# Patient Record
Sex: Male | Born: 1945 | Race: White | Hispanic: No | Marital: Married | Smoking: Former smoker
Health system: Southern US, Community
[De-identification: ages and names within clinical notes are randomized; demographics above are authoritative.]

## PROBLEM LIST (undated history)

## (undated) DIAGNOSIS — E119 Type 2 diabetes mellitus without complications: Secondary | ICD-10-CM

## (undated) DIAGNOSIS — N179 Acute kidney failure, unspecified: Secondary | ICD-10-CM

## (undated) DIAGNOSIS — I10 Essential (primary) hypertension: Secondary | ICD-10-CM

## (undated) DIAGNOSIS — I509 Heart failure, unspecified: Secondary | ICD-10-CM

---

## 2016-08-05 ENCOUNTER — Other Ambulatory Visit (HOSPITAL_COMMUNITY): Payer: Medicare Other

## 2016-08-05 ENCOUNTER — Inpatient Hospital Stay
Admission: EM | Admit: 2016-08-05 | Discharge: 2016-09-04 | Disposition: A | Discharge status: Skilled Nursing Facility | Payer: Medicare Other | Source: Intra-hospital | Attending: Internal Medicine | Admitting: Internal Medicine

## 2016-08-05 DIAGNOSIS — K567 Ileus, unspecified: Secondary | ICD-10-CM

## 2016-08-05 DIAGNOSIS — R609 Edema, unspecified: Secondary | ICD-10-CM

## 2016-08-05 DIAGNOSIS — T85598A Other mechanical complication of other gastrointestinal prosthetic devices, implants and grafts, initial encounter: Secondary | ICD-10-CM

## 2016-08-05 DIAGNOSIS — J9690 Respiratory failure, unspecified, unspecified whether with hypoxia or hypercapnia: Secondary | ICD-10-CM

## 2016-08-05 DIAGNOSIS — Z0189 Encounter for other specified special examinations: Secondary | ICD-10-CM

## 2016-08-05 DIAGNOSIS — O2230 Deep phlebothrombosis in pregnancy, unspecified trimester: Secondary | ICD-10-CM

## 2016-08-05 DIAGNOSIS — K829 Disease of gallbladder, unspecified: Secondary | ICD-10-CM

## 2016-08-05 DIAGNOSIS — J189 Pneumonia, unspecified organism: Secondary | ICD-10-CM

## 2016-08-05 DIAGNOSIS — R0602 Shortness of breath: Secondary | ICD-10-CM

## 2016-08-05 HISTORY — DX: Heart failure, unspecified: I50.9

## 2016-08-05 HISTORY — DX: Acute kidney failure, unspecified: N17.9

## 2016-08-05 HISTORY — DX: Essential (primary) hypertension: I10

## 2016-08-05 HISTORY — DX: Type 2 diabetes mellitus without complications: E11.9

## 2016-08-06 ENCOUNTER — Other Ambulatory Visit (HOSPITAL_COMMUNITY): Payer: Medicare Other

## 2016-08-06 LAB — CBC
HEMATOCRIT: 28.8 % — AB (ref 39.0–52.0)
Hemoglobin: 9.1 g/dL — ABNORMAL LOW (ref 13.0–17.0)
MCH: 27.2 pg (ref 26.0–34.0)
MCHC: 31.6 g/dL (ref 30.0–36.0)
MCV: 86.2 fL (ref 78.0–100.0)
Platelets: 141 10*3/uL — ABNORMAL LOW (ref 150–400)
RBC: 3.34 MIL/uL — AB (ref 4.22–5.81)
RDW: 15.3 % (ref 11.5–15.5)
WBC: 8.8 10*3/uL (ref 4.0–10.5)

## 2016-08-06 LAB — URINALYSIS, ROUTINE W REFLEX MICROSCOPIC
GLUCOSE, UA: NEGATIVE mg/dL
KETONES UR: 15 mg/dL — AB
Nitrite: POSITIVE — AB
PROTEIN: 100 mg/dL — AB
Specific Gravity, Urine: 1.013 (ref 1.005–1.030)
pH: 5 (ref 5.0–8.0)

## 2016-08-06 LAB — COMPREHENSIVE METABOLIC PANEL
ALT: 9 U/L — ABNORMAL LOW (ref 17–63)
AST: 15 U/L (ref 15–41)
Albumin: 3.1 g/dL — ABNORMAL LOW (ref 3.5–5.0)
Alkaline Phosphatase: 45 U/L (ref 38–126)
Anion gap: 14 (ref 5–15)
BUN: 118 mg/dL — AB (ref 6–20)
CHLORIDE: 100 mmol/L — AB (ref 101–111)
CO2: 21 mmol/L — AB (ref 22–32)
Calcium: 8.1 mg/dL — ABNORMAL LOW (ref 8.9–10.3)
Creatinine, Ser: 4.79 mg/dL — ABNORMAL HIGH (ref 0.61–1.24)
GFR, EST AFRICAN AMERICAN: 13 mL/min — AB (ref >60)
GFR, EST NON AFRICAN AMERICAN: 11 mL/min — AB (ref >60)
Glucose, Bld: 294 mg/dL — ABNORMAL HIGH (ref 65–99)
POTASSIUM: 4.5 mmol/L (ref 3.5–5.1)
SODIUM: 135 mmol/L (ref 135–145)
Total Bilirubin: 0.8 mg/dL (ref 0.3–1.2)
Total Protein: 5 g/dL — ABNORMAL LOW (ref 6.5–8.1)

## 2016-08-06 LAB — URINE MICROSCOPIC-ADD ON

## 2016-08-07 ENCOUNTER — Encounter: Payer: Self-pay | Admitting: Pulmonary Disease

## 2016-08-07 DIAGNOSIS — J9620 Acute and chronic respiratory failure, unspecified whether with hypoxia or hypercapnia: Secondary | ICD-10-CM

## 2016-08-07 LAB — HEPATITIS B SURFACE ANTIBODY,QUALITATIVE: HEP B S AB: NONREACTIVE

## 2016-08-07 LAB — HEPATITIS B SURFACE ANTIGEN: HEP B S AG: NEGATIVE

## 2016-08-07 LAB — HEPATITIS B CORE ANTIBODY, TOTAL: Hep B Core Total Ab: NEGATIVE

## 2016-08-07 NOTE — Consult Note (Signed)
Name: Zachary Galvan R Hunsinger MRN: [id number] DOB: 05-07-1946    ADMISSION DATE:  08/05/2016 CONSULTATION DATE: 08/07/16   REFERRING MD :  Dr. 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 He had developed acute kidney injury in the setting of sepsis and acute respiratory failure. He was placed on BiPAP but failed requiring intubation. Subsequently was extubated and failed a second time requiring reintubation. Patient required hemodialysis for acute kidney injury.  He also was reportedly found to have a fungal UTI and was treated accordingly.  Course further complicated by hematuria and anticoagulation was held.  [facility].   At baseline, family reports he is on spiriva, symbicort, prednisone 10 mg and PRN nebulizer's.  PAST MEDICAL HISTORY :   has a past medical history of AKI (acute kidney injury) (HCC); CHF (congestive heart failure) (HCC); DM2 (diabetes mellitus, type 2) (HCC); and HTN (hypertension).   has no past surgical history on file.  Prior to Admission medications   Not on File    Allergies not on file  FAMILY HISTORY:  family history is not on file.  SOCIAL HISTORY:  reports that he quit smoking about 7 years ago. His smoking use included Cigarettes. He has a 40.00 pack-year smoking history. He has never used smokeless tobacco.  REVIEW OF SYSTEMS:  Unable to complete as patient is on mechanical ventilation.   SUBJECTIVE:   VITAL SIGNS: BP: ()/()  Arterial Line BP: ()/()   PHYSICAL EXAMINATION: General:  Obese male  in NAD on vent  Neuro:  Awake/alert, MAE  HEENT:  ETT in place Cardiovascular:  s1s2 rrr, no m/r/g  Lungs:  Even/non-labored, lungs bilaterally clear, diminished lower  Abdomen:  Obese/soft, bsx4 active  Musculoskeletal:  No acute deformities  Skin:  Warm/dry, no edema    Recent Labs Lab 08/06/16 0809  NA 135  K 4.5  CL 100*  CO2 21*  BUN 118*  CREATININE 4.79*  GLUCOSE 294*     Recent Labs Lab 08/06/16 0809  HGB 9.1*  HCT 28.8*  WBC 8.8  PLT 141*    Dg Chest Port 1 View  Result Date: 08/06/2016 CLINICAL DATA:  Respiratory for EXAM: PORTABLE CHEST 1 VIEW COMPARISON:  None FINDINGS: Endotracheal tube a is 6 cm above the carina. Nd left AICD remains in place, unchanged. Heart is normal size. No confluent airspace opacities or effusions. Mild hyperinflation. IMPRESSION: Mild hyperinflation.  No acute findings. Endotracheal tube 6 cm above the carina. Electronically Signed   By: Charlett NoseKevin  Dover M.D.   On: 08/06/2016 08:08   Dg Abd Portable 1v  Result Date: 08/05/2016 CLINICAL DATA:  Orogastric tube placement EXAM: PORTABLE ABDOMEN - 1 VIEW COMPARISON:  Study obtained earlier in the day FINDINGS: Orogastric tube tip and side port are in the distal stomach. Note that the stomach is located to the right of midline. There are loops of dilated colon, likely indicative of a degree of ileus. No free air evident. IMPRESSION: Suspect a degree of ileus. Nasogastric tube tip and side port below the diaphragm stomach. Note that the stomach is located to the right of  midline. Electronically Signed   By: Bretta BangWilliam  Woodruff III M.D.   On: 08/05/2016 20:50   Dg Abd Portable 1v  Result Date: 08/05/2016 CLINICAL DATA:  OG tube placement. EXAM: PORTABLE ABDOMEN - 1 VIEW COMPARISON:  None. FINDINGS: What appears to be the tip of an OG tube overlies the distal esophagus. IMPRESSION: What appears to be the OG tube tip overlies the distal esophagus. Electronically Signed   By: Harmon PierJeffrey  Hu M.D.   On:  08/05/2016 16:30      SIGNIFICANT EVENTS  11/18  [facility]   STUDIES:     ASSESSMENT / PLAN:  Discussion:  70 y/o M with O2 + pred dependent COPD with recent admit for klebsiella bacteremia secondary to pyelonephritis with associated sepsis, AKI, prolonged vent needs.     Acute on Chronic Respiratory Failure O2 + Prednisone Dependent COPD - baseline 10 mg pred dependent  OSA / OHS Recent Klebsiella UTI / Bacteremia   Plan: Continue AC/VC as rest mode Daily SBT / WUA  Plan for SBT in am @ 10:00 with review for extubation  CXR in am  Add pulmicort, brovana + Q6 atrovent Reduce solu-medrol to 40 mg IV Q12  PCCM will see daily while orally intubated.   [name], [name] [place] [facility] Pgr: [phone] or if no answer [phone] 08/07/2016, 3:23 PM   Attending note: I have seen and examined the patient with nurse practitioner/resident and agree with the note. History, labs and imaging reviewed.  70 Y/O with O2 dependent COPD on home O2, chronic prednisone, hypertension, OSA on home vent. With recent hospitalization for urosepsis from Klebsiella. [facility] where he was extubated but failed BiPAP postextubation requiring reintubation. He is on hemodialysis for acute kidney injury and is being treated with meropenem and Diflucan for UTI.  On examination he has stable vital signs.  Weaning on pressure support mode. Labs and imaging reviewed.  Plan: We will add Pulmicort and Brovana with Atrovent for COPD. Continue tapering Solu-Medrol to 40 mg IV q12. Continue weaning. Plan to go on 5/5 PSV. We will recheck on him tomorrow morning. Discussed with patient, wife and daughter at bedside.  The patient is critically ill with multiple organ systems failure and requires high complexity decision making for assessment and support, frequent evaluation and titration of therapies, application of advanced monitoring technologies and extensive  interpretation of multiple databases. Critical Care Time devoted to patient care services described in this note independent of APP time is 35 minutes. Chilton Greathouse.  Gregg Holster MD [place] [facility] Pager [phone] If no answer or after 3pm call: [phone] 08/07/2016, 9:30 PM

## 2016-08-07 NOTE — Consult Note (Signed)
[facility] CONSULT NOTE    Date: 08/07/2016                  Patient Name:  Zachary Galvan  MRN: [id number]  DOB: [birth year]  Age / Sex: 70 y.o., male         PCP: No primary care provider on file.                 Service Requesting Consult: Hospitalisit                 Reason for Consult: Acute renal failure            History of Present Illness: Patient is a 70 y.o. male past medical history of hypertension, combined systolic and diastolic heart failure status post AICD placement, diabetes mellitus type 2, COPD, obstructive sleep apnea, hypertension, recent Klebsiella pneumonia UTI with pyelonephritis, and recent acute respiratory failure requiring mechanical ventilation. He is unable to offer any history at this point in time. The patient's wife and daughter at bedside. He is being actively managed by pulmonary critical care for his underlying respiratory failure. In regards to his acute renal failure patient's family states that he did not appear to have any underlying chronic kidney disease.  The patient's baseline creatinine appears to be 1.0. While at [facility] it appears that he received multiple dialysis treatments. We scheduled for the patient to have dialysis yesterday as well. He appears to be breathing comfortably while on the ventilator at the moment.  Medications:   Current medications: Fluconazole 200 mg IV daily, meropenem 500 mg IV twice a day, Humalog insulin, digoxin 0.125 mg daily, ipratropium 2.5 mLinhaled 3 times a day, Lantus insulin 8 units each bedtime, Xopenex 1.25 mg inhaled 3 times a day, melatonin 6 mg each bedtime, Solu-Medrol 60 mg IV every 8 hours, renal vitamin 1 tablet daily, Protonix 40 mg twice a day, requip 0.5 mg 3 times a day, Singulair 10 mg each bedtime, sotalol 120 mg twice a day, vitamin C 500 mg twice a day, vitamin D 1000units daily    Allergies: Albuterol, codeine, Lipitor, Zithromax, Bactrim,  Ceclor   Past Medical History: Past Medical History:  Diagnosis Date  . AKI (acute kidney injury) (HCC)   . CHF (congestive heart failure) (HCC)   . DM2 (diabetes mellitus, type 2) (HCC)   . HTN (hypertension)      Past Surgical History: No past surgical history on file.   Family History: No family history of end-stage renal disease.  Social History: Social History   Social History  . Marital status: Married    Spouse name: N/A  . Number of children: N/A  . Years of education: N/A   Occupational History  . Not on file.   Social History Main Topics  . Smoking status: Former Smoker    Packs/day: 1.00    Years: 40.00    Types: Cigarettes    Quit date: 08/07/2009  . Smokeless tobacco: Never Used  . Alcohol use Not on file  . Drug use: Unknown  . Sexual activity: Not on file   Other Topics Concern  . Not on file   Social History Narrative  . No narrative on file     Review of Systems: Patient cannot provide review of systems as he is maintained on the ventilator.  Vital Signs: Temperature 90.7 pulse 76 respirations 22 blood pressure 112/52 Weight trends: There were no vitals filed for this visit.  Physical Exam: General: NAD, breathing comfortably on the ventilator.  Head: Normocephalic, atraumatic.  Eyes: Anicteric, EOMI  Nose: Mucous membranes moist, not inflammed, nonerythematous.  Throat: Endotracheal tube in place   Neck: Supple, trachea midline.  Lungs:  Scattered rhonchi, vent assisted  Heart: Irregular, no rubs  Abdomen:  BS normoactive. Soft, Nondistended, non-tender.  No masses or organomegaly.  Extremities: No pretibial edema.  Neurologic: Awake and alert, able to move all 4 extremeties  Skin: No visible rashes    Lab results: Basic Metabolic Panel:  Recent Labs Lab 08/06/16 0809  NA 135  K 4.5  CL 100*  CO2 21*  GLUCOSE 294*  BUN 118*  CREATININE 4.79*  CALCIUM 8.1*    Liver Function Tests:  Recent Labs Lab  08/06/16 0809  AST 15  ALT 9*  ALKPHOS 45  BILITOT 0.8  PROT 5.0*  ALBUMIN 3.1*   No results for input(s): LIPASE, AMYLASE in the last 168 hours. No results for input(s): AMMONIA in the last 168 hours.  CBC:  Recent Labs Lab 08/06/16 0809  WBC 8.8  HGB 9.1*  HCT 28.8*  MCV 86.2  PLT 141*    Cardiac Enzymes: No results for input(s): CKTOTAL, CKMB, CKMBINDEX, TROPONINI in the last 168 hours.  BNP: Invalid input(s): POCBNP  CBG: No results for input(s): GLUCAP in the last 168 hours.  Microbiology: No results found for this or any previous visit.  Coagulation Studies: No results for input(s): LABPROT, INR in the last 72 hours.  Urinalysis:  Recent Labs  08/06/16 0113  COLORURINE BROWN*  LABSPEC 1.013  PHURINE 5.0  GLUCOSEU NEGATIVE  HGBUR LARGE*  BILIRUBINUR MODERATE*  KETONESUR 15*  PROTEINUR 100*  NITRITE POSITIVE*  LEUKOCYTESUR LARGE*      Imaging: Dg Chest Port 1 View  Result Date: 08/06/2016 CLINICAL DATA:  Respiratory for EXAM: PORTABLE CHEST 1 VIEW COMPARISON:  None FINDINGS: Endotracheal tube a is 6 cm above the carina. Nd left AICD remains in place, unchanged. Heart is normal size. No confluent airspace opacities or effusions. Mild hyperinflation. IMPRESSION: Mild hyperinflation.  No acute findings. Endotracheal tube 6 cm above the carina. Electronically Signed   By: Charlett NoseKevin  Dover M.D.   On: 08/06/2016 08:08   Dg Abd Portable 1v  Result Date: 08/05/2016 CLINICAL DATA:  Orogastric tube placement EXAM: PORTABLE ABDOMEN - 1 VIEW COMPARISON:  Study obtained earlier in the day FINDINGS: Orogastric tube tip and side port are in the distal stomach. Note that the stomach is located to the right of midline. There are loops of dilated colon, likely indicative of a degree of ileus. No free air evident. IMPRESSION: Suspect a degree of ileus. Nasogastric tube tip and side port below the diaphragm stomach. Note that the stomach is located to the right of  midline. Electronically Signed   By: Bretta BangWilliam  Woodruff III M.D.   On: 08/05/2016 20:50      Assessment & Plan: Pt is a 70 y.o. male past medical history of hypertension, combined systolic and diastolic heart failure status post AICD placement, diabetes mellitus type 2, COPD, obstructive sleep apnea, hypertension, recent Klebsiella pneumonia UTI with pyelonephritis, and recent acute respiratory failure requiring mechanical ventilation.  1. Acute renal failure/proteinuria. Baseline creatinine 1.0. Acute renal failure secndary to sepsis most likely. Patient has been requiring hemodialysis recently.  bUN yesterday was 118 with a creatinine of 4.79. Patient underwent hemodialysis yesterday. We will plan for hemodialysis again tomorrow.  2. Acute respiratory failure. Continue mechanical ventilation for now. Appreciate  input from pulmonary critical care.  3. Anemia not otherwise specified. Hemoglobin currently 9.1. Hold off on Aranesp for now.

## 2016-08-08 ENCOUNTER — Encounter (HOSPITAL_BASED_OUTPATIENT_CLINIC_OR_DEPARTMENT_OTHER): Payer: Medicare Other

## 2016-08-08 ENCOUNTER — Other Ambulatory Visit (HOSPITAL_COMMUNITY): Payer: Medicare Other

## 2016-08-08 DIAGNOSIS — M7989 Other specified soft tissue disorders: Secondary | ICD-10-CM

## 2016-08-08 DIAGNOSIS — J9620 Acute and chronic respiratory failure, unspecified whether with hypoxia or hypercapnia: Secondary | ICD-10-CM | POA: Diagnosis not present

## 2016-08-08 LAB — CBC
HCT: 27.4 % — ABNORMAL LOW (ref 39.0–52.0)
Hemoglobin: 9 g/dL — ABNORMAL LOW (ref 13.0–17.0)
MCH: 28 pg (ref 26.0–34.0)
MCHC: 32.8 g/dL (ref 30.0–36.0)
MCV: 85.1 fL (ref 78.0–100.0)
PLATELETS: 124 10*3/uL — AB (ref 150–400)
RBC: 3.22 MIL/uL — AB (ref 4.22–5.81)
RDW: 14.8 % (ref 11.5–15.5)
WBC: 11.3 10*3/uL — AB (ref 4.0–10.5)

## 2016-08-08 LAB — RENAL FUNCTION PANEL
Albumin: 2.9 g/dL — ABNORMAL LOW (ref 3.5–5.0)
Anion gap: 13 (ref 5–15)
BUN: 113 mg/dL — ABNORMAL HIGH (ref 6–20)
CALCIUM: 7.5 mg/dL — AB (ref 8.9–10.3)
CO2: 25 mmol/L (ref 22–32)
CREATININE: 3.95 mg/dL — AB (ref 0.61–1.24)
Chloride: 96 mmol/L — ABNORMAL LOW (ref 101–111)
GFR, EST AFRICAN AMERICAN: 16 mL/min — AB (ref >60)
GFR, EST NON AFRICAN AMERICAN: 14 mL/min — AB (ref >60)
Glucose, Bld: 277 mg/dL — ABNORMAL HIGH (ref 65–99)
Phosphorus: 5.6 mg/dL — ABNORMAL HIGH (ref 2.5–4.6)
Potassium: 4.7 mmol/L (ref 3.5–5.1)
SODIUM: 134 mmol/L — AB (ref 135–145)

## 2016-08-08 LAB — DIGOXIN LEVEL: DIGOXIN LVL: 1.1 ng/mL (ref 0.8–2.0)

## 2016-08-08 NOTE — Progress Notes (Signed)
Name: Zachary Galvan MRN: [id number] DOB: [birth year]    ADMISSION DATE:  08/05/2016 CONSULTATION DATE: 08/07/16   REFERRING MD :  Dr. 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 He had developed acute kidney injury in the setting of sepsis and acute respiratory failure. He was placed on BiPAP but failed requiring intubation. Subsequently was extubated and failed a second time requiring reintubation. Patient required hemodialysis for acute kidney injury.  He also was reportedly found to have a fungal UTI and was treated accordingly.  Course further complicated by hematuria and anticoagulation was held.  [facility].   At baseline, family reports he is on spiriva, symbicort, prednisone 10 mg and PRN nebulizer's.   SUBJECTIVE:  Family reports concern over LUE ultrasound (still pending).  RN reports pt vomited this am and had HD.  No wean per RT.   VITAL SIGNS: BP: ()/()  Arterial Line BP: ()/()   PHYSICAL EXAMINATION: General:  Obese male in NAD on vent  Neuro:  Awake/alert, MAE, communicates appropriately   HEENT:  ETT in place Cardiovascular:  s1s2 rrr, no m/r/g  Lungs:  Even/non-labored, lungs bilaterally clear, diminished lower  Abdomen:  Obese/soft, bsx4 active  Musculoskeletal:  No acute deformities  Skin:  Warm/dry, no edema    Recent Labs Lab 08/06/16 0809 08/08/16 0617  NA 135 134*  K 4.5 4.7  CL 100* 96*  CO2 21* 25  BUN 118* 113*  CREATININE 4.79* 3.95*  GLUCOSE 294* 277*     Recent Labs Lab 08/06/16 0809 08/08/16 0618    HGB 9.1* 9.0*  HCT 28.8* 27.4*  WBC 8.8 11.3*  PLT 141* 124*    No results found.    SIGNIFICANT EVENTS  [facility]  [facility]   STUDIES:  LUE Duplex 11/21 >>   ASSESSMENT / PLAN:  Discussion:  70 y/o M with O2 + pred dependent COPD with recent admit for klebsiella bacteremia secondary to pyelonephritis with associated sepsis, AKI, prolonged vent needs.     Acute on Chronic Respiratory Failure O2 + Prednisone Dependent COPD - baseline 10 mg pred dependent  OSA / OHS Recent Klebsiella UTI / Bacteremia   Plan: Continue AC/VC as rest mode Daily SBT / WUA  No extubation today with N/V Plan for SBT in am ~ 10:00 with review for extubation  Await 11/21 CXR evaluation Continue pulmicort, brovana + Q6 atrovent Continue solu-medrol to 40 mg IV Q12 Minimize sedating medications, recommend weaning klonopin to off    LUE Swelling   Plan: Venous Duplex pending, per primary SVC  Nausea / Vomiting   Plan: Per primary SVC  PCCM will see daily while orally intubated.   [name], [name] [place] [facility] Pgr: [phone] or if no answer [phone] 08/08/2016, 12:34 PM  Attending note: I have seen and examined the patient with nurse practitioner/resident and agree with the note. History, labs and imaging reviewed.  70 Y/O with COPD, DM, heart failure s/p AICD, DM, OSA on trilogy vent admitted with sepsis secondary to kelb urosepsis. Course complicated by kidney failure requiring HD. Re intubated after he failed  Bipap Continue weaning trials NO weaning today because of N/V Continue nebs.  The patient is critically ill with multiple organ systems failure and requires high complexity decision making for assessment and support, frequent evaluation and titration of therapies, application of advanced monitoring technologies and extensive interpretation of multiple databases. Critical Care Time devoted to patient care services described in this note independent of  APP time is 45 minutes.   Chilton GreathousePraveen Doyce Saling MD [place] [facility] Pager [phone] If no answer or after 3pm call: [phone] 08/08/2016, 3:26 PM

## 2016-08-08 NOTE — Progress Notes (Signed)
**  Preliminary report by tech**  Left upper extremity venous duplex complete. There is no obvious evidence of deep or superficial vein thrombosis involving the left upper extremity. All visualized vessels appear patent and compressible.  Results were given to the patient's nurse, Sue LushAndrea.  08/08/16 3:03 PM Olen CordialGreg Lorey Pallett RVT

## 2016-08-08 NOTE — Consult Note (Signed)
   Name: Zachary Galvan MRN: [id number] DOB: 02/22/1946    ADMISSION DATE:  08/05/2016 CONSULTATION DATE: 08/07/16   REFERRING MD :  Dr. 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 He had developed acute kidney injury in the setting of sepsis and acute respiratory failure. He was placed on BiPAP but failed requiring intubation. Subsequently was extubated and failed a second time requiring reintubation. Patient required hemodialysis for acute kidney injury.  He also was reportedly found to have a fungal UTI and was treated accordingly.  Course further complicated by hematuria and anticoagulation was held.  [facility].   At baseline, family reports he is on spiriva, symbicort, prednisone 10 mg and PRN nebulizer's.   SUBJECTIVE:  Family reports concern over LUE ultrasound (still pending).  RN reports pt vomited this am and had HD.  No wean per RT.   VITAL SIGNS: BP: ()/()  Arterial Line BP: ()/()   PHYSICAL EXAMINATION: General:  Obese male in NAD on vent  Neuro:  Awake/alert, MAE, communicates appropriately   HEENT:  ETT in place Cardiovascular:  s1s2 rrr, no m/r/g  Lungs:  Even/non-labored, lungs bilaterally clear, diminished lower  Abdomen:  Obese/soft, bsx4 active  Musculoskeletal:  No acute deformities  Skin:  Warm/dry, no edema    Recent Labs Lab 08/06/16 0809 08/08/16 0617  NA 135 134*  K 4.5 4.7  CL 100* 96*  CO2 21* 25  BUN 118* 113*  CREATININE 4.79* 3.95*  GLUCOSE 294* 277*     Recent Labs Lab 08/06/16 0809 08/08/16 0618    HGB 9.1* 9.0*  HCT 28.8* 27.4*  WBC 8.8 11.3*  PLT 141* 124*    No results found.    SIGNIFICANT EVENTS  11[facility]18  [facility]   STUDIES:  LUE Duplex 11/21 >>   ASSESSMENT / PLAN:  Discussion:  70 y/o M with O2 + pred dependent COPD with recent admit for klebsiella bacteremia secondary to pyelonephritis with associated sepsis, AKI, prolonged vent needs.     Acute on Chronic Respiratory Failure O2 + Prednisone Dependent COPD - baseline 10 mg pred dependent  OSA / OHS Recent Klebsiella UTI / Bacteremia   Plan: Continue AC/VC as rest mode Daily SBT / WUA  No extubation today with N/V Plan for SBT in am ~ 10:00 with review for extubation  CXR in am  Continue pulmicort, brovana + Q6 atrovent Continue solu-medrol to 40 mg IV Q12 Minimize sedating medications, recommend weaning klonopin to off    LUE Swelling   Plan: Venous Duplex pending, per primary SVC  Nausea / Vomiting   Plan: Per primary SVC  PCCM will see daily while orally intubated.   Canary BrimBrandi Laya Letendre, [name] [place] [facility] Pgr: [phone] or if no answer [phone] 08/08/2016, 12:30 PM

## 2016-08-09 ENCOUNTER — Other Ambulatory Visit (HOSPITAL_COMMUNITY): Payer: Medicare Other

## 2016-08-09 DIAGNOSIS — J9600 Acute respiratory failure, unspecified whether with hypoxia or hypercapnia: Secondary | ICD-10-CM | POA: Diagnosis not present

## 2016-08-09 NOTE — Progress Notes (Signed)
[facility]  ROUNDING NOTE   Subjective:  The patient had hemodialysis yesterday. He has now been extubated. Awake alert and appears to be in good spirits.   Objective:  Vital signs in last 24 hours:  Temperature 96.7 pulse 60 respirations 18 blood pressure 130/59 Physical Exam: General: No acute distress  Head: Normocephalic, atraumatic. Moist oral mucosal membranes  Eyes: Anicteric  Neck: Supple, trachea midline  Lungs:  Scattered rhonchi, normal effort  Heart: S1S2 no rubs  Abdomen:  Soft, nontender, bowel sounds present  Extremities: trace peripheral edema.  Neurologic: Nonfocal, moving all four extremities  Skin: No lesions  Access: L IJ dialysis catheter    Basic Metabolic Panel:  Recent Labs Lab 08/06/16 0809 08/08/16 0617  NA 135 134*  K 4.5 4.7  CL 100* 96*  CO2 21* 25  GLUCOSE 294* 277*  BUN 118* 113*  CREATININE 4.79* 3.95*  CALCIUM 8.1* 7.5*  PHOS  --  5.6*    Liver Function Tests:  Recent Labs Lab 08/06/16 0809 08/08/16 0617  AST 15  --   ALT 9*  --   ALKPHOS 45  --   BILITOT 0.8  --   PROT 5.0*  --   ALBUMIN 3.1* 2.9*   No results for input(s): LIPASE, AMYLASE in the last 168 hours. No results for input(s): AMMONIA in the last 168 hours.  CBC:  Recent Labs Lab 08/06/16 0809 08/08/16 0618  WBC 8.8 11.3*  HGB 9.1* 9.0*  HCT 28.8* 27.4*  MCV 86.2 85.1  PLT 141* 124*    Cardiac Enzymes: No results for input(s): CKTOTAL, CKMB, CKMBINDEX, TROPONINI in the last 168 hours.  BNP: Invalid input(s): POCBNP  CBG: No results for input(s): GLUCAP in the last 168 hours.  Microbiology: No results found for this or any previous visit.  Coagulation Studies: No results for input(s): LABPROT, INR in the last 72 hours.  Urinalysis: No results for input(s): COLORURINE, LABSPEC, PHURINE, GLUCOSEU, HGBUR, BILIRUBINUR, KETONESUR, PROTEINUR, UROBILINOGEN, NITRITE, LEUKOCYTESUR in the last 72 hours.  Invalid input(s):  APPERANCEUR    Imaging: Dg Chest Port 1 View  Result Date: 08/08/2016 CLINICAL DATA:  Ventilator dependent respiratory failure. EXAM: PORTABLE CHEST 1 VIEW COMPARISON:  08/06/2016. FINDINGS: Endotracheal tube tip in satisfactory position projecting approximately 6 cm above the carina. Left subclavian biventricular pacing defibrillator unchanged. Cardiac silhouette upper normal in size, unchanged. Thoracic aorta mildly atherosclerotic, unchanged. Hilar and mediastinal contours otherwise unremarkable. Lungs clear. Bronchovascular markings normal. Pulmonary vascularity normal. No visible pleural effusions. No pneumothorax. IMPRESSION: 1.  Support apparatus satisfactory. 2.  No acute cardiopulmonary disease. 3. Mild thoracic aortic atherosclerosis. Electronically Signed   By: Hulan Saashomas  Lawrence M.D.   On: 08/08/2016 14:10   Dg Abd Portable 1v  Result Date: 08/08/2016 CLINICAL DATA:  Ileus EXAM: PORTABLE ABDOMEN - 1 VIEW COMPARISON:  08/05/2016 FINDINGS: The descending and sigmoid colon is distended and contains stool, similar to prior. Nasogastric or orogastric tube within the moderately gas distended stomach. No evidence of small bowel obstruction. Limited study due to patient size and pannus displacement to the right. IMPRESSION: 1. Continued colonic ileus, with greater stool volume. 2. Moderate gaseous distention of the stomach. Orogastric tube in good position. Electronically Signed   By: Marnee SpringJonathon  Watts M.D.   On: 08/08/2016 13:46     Medications:       Assessment/ Plan:  70 y.o. 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  1. Acute renal failure/proteinuria. Baseline creatinine 1.0. Acute renal failure secndary to sepsis most  likely.  -  Patient still has azotemia. He reports that he is producing urine. We will plan for hemodialysis again on Friday and then reassess the patient for analysis early next week.  2. Acute respiratory failure. The patient has been successfully weaned off the ventilator and was extubated today. He appears to be doing well. Continue to monitor respiratory status closely.  3. Anemia not otherwise specified. Hemoglobin currently 9.0. Continue to monitor CBC for now. Hold off on Aranesp for now.   LOS: 0 Yamina Lenis 11/22/20173:52 PM

## 2016-08-09 NOTE — Progress Notes (Signed)
Name: Zachary Galvan MRN: [id number] DOB: 05-Jun-1946    ADMISSION DATE:  08/05/2016 CONSULTATION DATE: 08/07/16   REFERRING MD :  Dr. 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 He had developed acute kidney injury in the setting of sepsis and acute respiratory failure. He was placed on BiPAP but failed requiring intubation. Subsequently was extubated and failed a second time requiring reintubation. Patient required hemodialysis for acute kidney injury.  He also was reportedly found to have a fungal UTI and was treated accordingly.  Course further complicated by hematuria and anticoagulation was held.  [facility].   At baseline, family reports he is on spiriva, symbicort, prednisone, nocturnal bipap and O2 3l [place] 10 mg and PRN nebulizer's.   SUBJECTIVE: Tube feeds on. Will need to stop for extubation  VITAL SIGNS: Vital signs reviewed. Abnormal values will appear under impression plan section.   PHYSICAL EXAMINATION: General:  Obese male in NAD on vent  Neuro:  Awake/alert, MAE, communicates appropriately   HEENT:  ETT in place.ngt with tf Cardiovascular:  s1s2 rrr, no m/r/g  Lungs:  Even/non-labored, lungs bilaterally clear, diminished lower , ps 5 with Vt 480 and 700 with effort. O2 28% Abdomen:  Obese/soft, bsx4 active  Musculoskeletal:  No acute deformities  Skin:  Warm/dry, no edema    Recent Labs Lab 08/06/16 0809 08/08/16 0617  NA 135 134*  K 4.5 4.7  CL 100* 96*  CO2 21* 25  BUN 118* 113*  CREATININE 4.79* 3.95*  GLUCOSE 294*  277*     Recent Labs Lab 08/06/16 0809 08/08/16 0618  HGB 9.1* 9.0*  HCT 28.8* 27.4*  WBC 8.8 11.3*  PLT 141* 124*    Dg Chest Port 1 View  Result Date: 08/08/2016 CLINICAL DATA:  Ventilator dependent respiratory failure. EXAM: PORTABLE CHEST 1 VIEW COMPARISON:  08/06/2016. FINDINGS: Endotracheal tube tip in satisfactory position projecting approximately 6 cm above the carina. Left subclavian biventricular pacing defibrillator unchanged. Cardiac silhouette upper normal in size, unchanged. Thoracic aorta mildly atherosclerotic, unchanged. Hilar and mediastinal contours otherwise unremarkable. Lungs clear. Bronchovascular markings normal. Pulmonary vascularity normal. No visible pleural effusions. No pneumothorax. IMPRESSION: 1.  Support apparatus satisfactory. 2.  No acute cardiopulmonary disease. 3. Mild thoracic aortic atherosclerosis. Electronically Signed   By: Hulan Saashomas  Lawrence M.D.   On: 08/08/2016 14:10   Dg Abd Portable 1v  Result Date: 08/08/2016 CLINICAL DATA:  Ileus EXAM: PORTABLE ABDOMEN - 1 VIEW COMPARISON:  08/05/2016 FINDINGS: The descending and sigmoid colon is distended and contains stool, similar to prior. Nasogastric or orogastric tube within the moderately gas distended stomach. No evidence of small bowel obstruction. Limited study due to patient size and pannus displacement to the right. IMPRESSION: 1. Continued colonic ileus, with greater stool volume. 2. Moderate gaseous distention of the stomach. Orogastric tube in good position. Electronically Signed   By: Marnee SpringJonathon  Watts M.D.   On: 08/08/2016 13:46      SIGNIFICANT EVENTS  [facility]  [facility]   STUDIES:  LUE Duplex 11/21 >>neg   ASSESSMENT / PLAN:  Discussion:  70 y/o M with O2 +  pred dependent COPD with recent admit for klebsiella bacteremia secondary to pyelonephritis with associated sepsis, AKI, prolonged vent needs.     Acute on Chronic Respiratory Failure O2 + Prednisone Dependent COPD - baseline 10  mg pred dependent  OSA / OHS Recent Klebsiella UTI / Bacteremia   Plan: Continue AC/VC as rest mode Daily SBT / WUA  Try for extubation today 11/22. Will need nocturnal and PRN bipap and 3 l o2 via [place] Plan for SBT in am if not extubated Continue pulmicort, brovana + Q6 atrovent Continue solu-medrol to 40 mg IV Q12 Minimize sedating medications, recommend weaning klonopin to off    LUE Swelling : improved  Plan: Venous Duplex neg  Nausea / Vomiting improved and tube feeds are on  Plan: Per primary SVC  PCCM will see daily while orally intubated.   Brett CanalesSteve Minor ACNP [facility] Pager [phone] till 3 pm If no answer page [phone] 08/09/2016, 11:06 AM  Attending note: I have seen and examined the patient with nurse practitioner/resident and agree with the note. History, labs and imaging reviewed.  70 Y/O with COPD, DM, heart failure s/p AICD, DM, OSA on trilogy vent admitted with sepsis secondary to kelb urosepsis. Course complicated by kidney failure requiring HD. Re intubated after he failed Bipap Extubate today. He will need Bipap as needed and mandatory Bipap at night. Continue nebs.  The patient is critically ill with multiple organ systems failure and requires high complexity decision making for assessment and support, frequent evaluation and titration of therapies, application of advanced monitoring technologies and extensive interpretation of multiple databases. Critical Care Time devoted to patient care services described in this note independent of APP time is 35 minutes.  Chilton GreathousePraveen Anaia Frith MD [facility] Pager [phone] If no answer or after 3pm call: [phone] 08/09/2016, 12:17 PM

## 2016-08-10 ENCOUNTER — Other Ambulatory Visit (HOSPITAL_COMMUNITY): Payer: Medicare Other

## 2016-08-11 NOTE — Progress Notes (Signed)
[facility]  ROUNDING NOTE   Subjective:  Pt seen at bedside. Appears to be doing well post extubation. Cr 3.95, EGFR 14 at last check.    Objective:  Vital signs in last 24 hours:  T: 97.0 P 60 R 18 BP 127/67   Physical Exam: General: No acute distress  Head: Normocephalic, atraumatic. Moist oral mucosal membranes  Eyes: Anicteric  Neck: Supple, trachea midline  Lungs:  Scattered rhonchi, normal effort  Heart: S1S2 no rubs  Abdomen:  Soft, nontender, bowel sounds present  Extremities: trace peripheral edema.  Neurologic: Nonfocal, moving all four extremities  Skin: No lesions  Access: L IJ dialysis catheter    Basic Metabolic Panel:  Recent Labs Lab 08/06/16 0809 08/08/16 0617  NA 135 134*  K 4.5 4.7  CL 100* 96*  CO2 21* 25  GLUCOSE 294* 277*  BUN 118* 113*  CREATININE 4.79* 3.95*  CALCIUM 8.1* 7.5*  PHOS  --  5.6*    Liver Function Tests:  Recent Labs Lab 08/06/16 0809 08/08/16 0617  AST 15  --   ALT 9*  --   ALKPHOS 45  --   BILITOT 0.8  --   PROT 5.0*  --   ALBUMIN 3.1* 2.9*   No results for input(s): LIPASE, AMYLASE in the last 168 hours. No results for input(s): AMMONIA in the last 168 hours.  CBC:  Recent Labs Lab 08/06/16 0809 08/08/16 0618  WBC 8.8 11.3*  HGB 9.1* 9.0*  HCT 28.8* 27.4*  MCV 86.2 85.1  PLT 141* 124*    Cardiac Enzymes: No results for input(s): CKTOTAL, CKMB, CKMBINDEX, TROPONINI in the last 168 hours.  BNP: Invalid input(s): POCBNP  CBG: No results for input(s): GLUCAP in the last 168 hours.  Microbiology: No results found for this or any previous visit.  Coagulation Studies: No results for input(s): LABPROT, INR in the last 72 hours.  Urinalysis: No results for input(s): COLORURINE, LABSPEC, PHURINE, GLUCOSEU, HGBUR, BILIRUBINUR, KETONESUR, PROTEINUR, UROBILINOGEN, NITRITE, LEUKOCYTESUR in the last 72 hours.  Invalid input(s): APPERANCEUR    Imaging: Dg Abd Portable 1v  Result  Date: 08/10/2016 CLINICAL DATA:  Ileus EXAM: PORTABLE ABDOMEN - 1 VIEW COMPARISON:  08/08/2016; 08/05/2016 FINDINGS: Examination degraded secondary to exclusion of the lower thorax and lateral aspects of the abdomen bilaterally. Nonobstructive bowel gas pattern. Nondiagnostic evaluation for pneumoperitoneum and portal venous gas. No definite pneumatosis. Vascular calcifications overlie the lower pelvis bilaterally. Otherwise, no definitive abnormal intra-abdominal calcifications. Post dynamic screw fixation of the right femoral neck, incompletely imaged. No definite acute osseus abnormalities. IMPRESSION: 1. Degraded examination without evidence of enteric obstruction. 2.  Aortic Atherosclerosis (ICD10-170.0) Electronically Signed   By: Sandi Mariscal M.D.   On: 08/10/2016 10:10     Medications:       Assessment/ Plan:  70 y.o. 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  1. Acute renal failure/proteinuria. Baseline creatinine 1.0. Acute renal failure secondary to sepsis most likely.  - Pt due for HD today, will continue to monitor renal parameters. We will plan for dialysis session on Monday as well. Hopefully his renal function will improve enough that he can come off of dialysis.  2. Acute respiratory failure. The patient continues to be doing well off the ventilator. He history. Continue gentle ultrafiltration with dialysis  to avoid pulmonary edema.  3. Anemia not otherwise specified. Continue to periodically monitor hemoglobin. Most recent hemoglobin was 9.0.  4.  Secondary hyperparathyroidism: last phos was 5.6, recheck on Monday.   Avoid binders for now.    LOS: 0 Ross Hefferan 11/24/201711:34 AM

## 2016-08-12 LAB — CBC
HEMATOCRIT: 32.1 % — AB (ref 39.0–52.0)
HEMOGLOBIN: 10.3 g/dL — AB (ref 13.0–17.0)
MCH: 27.8 pg (ref 26.0–34.0)
MCHC: 32.1 g/dL (ref 30.0–36.0)
MCV: 86.8 fL (ref 78.0–100.0)
Platelets: 120 10*3/uL — ABNORMAL LOW (ref 150–400)
RBC: 3.7 MIL/uL — AB (ref 4.22–5.81)
RDW: 14.6 % (ref 11.5–15.5)
WBC: 15.2 10*3/uL — ABNORMAL HIGH (ref 4.0–10.5)

## 2016-08-12 LAB — BASIC METABOLIC PANEL
Anion gap: 9 (ref 5–15)
BUN: 60 mg/dL — AB (ref 6–20)
CHLORIDE: 96 mmol/L — AB (ref 101–111)
CO2: 30 mmol/L (ref 22–32)
Calcium: 7.9 mg/dL — ABNORMAL LOW (ref 8.9–10.3)
Creatinine, Ser: 2.44 mg/dL — ABNORMAL HIGH (ref 0.61–1.24)
GFR calc non Af Amer: 25 mL/min — ABNORMAL LOW (ref >60)
GFR, EST AFRICAN AMERICAN: 29 mL/min — AB (ref >60)
Glucose, Bld: 183 mg/dL — ABNORMAL HIGH (ref 65–99)
POTASSIUM: 4.5 mmol/L (ref 3.5–5.1)
SODIUM: 135 mmol/L (ref 135–145)

## 2016-08-14 LAB — RENAL FUNCTION PANEL
ALBUMIN: 3 g/dL — AB (ref 3.5–5.0)
Anion gap: 10 (ref 5–15)
BUN: 97 mg/dL — AB (ref 6–20)
CO2: 30 mmol/L (ref 22–32)
CREATININE: 3.02 mg/dL — AB (ref 0.61–1.24)
Calcium: 7.9 mg/dL — ABNORMAL LOW (ref 8.9–10.3)
Chloride: 98 mmol/L — ABNORMAL LOW (ref 101–111)
GFR calc Af Amer: 23 mL/min — ABNORMAL LOW (ref >60)
GFR, EST NON AFRICAN AMERICAN: 20 mL/min — AB (ref >60)
GLUCOSE: 174 mg/dL — AB (ref 65–99)
PHOSPHORUS: 5.5 mg/dL — AB (ref 2.5–4.6)
POTASSIUM: 5.2 mmol/L — AB (ref 3.5–5.1)
SODIUM: 138 mmol/L (ref 135–145)

## 2016-08-14 LAB — CBC
HEMATOCRIT: 29.7 % — AB (ref 39.0–52.0)
Hemoglobin: 9.6 g/dL — ABNORMAL LOW (ref 13.0–17.0)
MCH: 28.2 pg (ref 26.0–34.0)
MCHC: 32.3 g/dL (ref 30.0–36.0)
MCV: 87.4 fL (ref 78.0–100.0)
PLATELETS: 90 10*3/uL — AB (ref 150–400)
RBC: 3.4 MIL/uL — ABNORMAL LOW (ref 4.22–5.81)
RDW: 14.6 % (ref 11.5–15.5)
WBC: 10.7 10*3/uL — AB (ref 4.0–10.5)

## 2016-08-14 NOTE — Progress Notes (Signed)
  [facility]  ROUNDING NOTE   Subjective:  Pt seen at bedside. Appears to be doing well  Cr 3.02, EGFR 20 at last check.  States he tolerated HD well today UOP appears to be increasing. 1825 cc last 24 hrs Wife at bedside   Objective:  Vital signs in last 24 hours:  Temperature 98.1, pulse 60, respirations 20, blood pressure 125/70   Physical Exam: General: No acute distress  Head: Normocephalic, atraumatic. Moist oral mucosal membranes  Eyes: Anicteric  Neck: Supple, trachea midline  Lungs:  Scattered rhonchi, coarse crackles, normal effort  Heart: S1S2 no rubs  Abdomen:  Soft, nontender, bowel sounds present  Extremities: trace peripheral edema.+ dependent edema in left arm  Neurologic: Nonfocal, moving all four extremities  Skin: No lesions  Access: L IJ dialysis catheter    Basic Metabolic Panel:  Recent Labs Lab 08/08/16 0617 08/12/16 0500 08/14/16 0700  NA 134* 135 138  K 4.7 4.5 5.2*  CL 96* 96* 98*  CO2 '25 30 30  '$ GLUCOSE 277* 183* 174*  BUN 113* 60* 97*  CREATININE 3.95* 2.44* 3.02*  CALCIUM 7.5* 7.9* 7.9*  PHOS 5.6*  --  5.5*    Liver Function Tests:  Recent Labs Lab 08/08/16 0617 08/14/16 0700  ALBUMIN 2.9* 3.0*   No results for input(s): LIPASE, AMYLASE in the last 168 hours. No results for input(s): AMMONIA in the last 168 hours.  CBC:  Recent Labs Lab 08/08/16 0618 08/12/16 0500 08/14/16 0500  WBC 11.3* 15.2* 10.7*  HGB 9.0* 10.3* 9.6*  HCT 27.4* 32.1* 29.7*  MCV 85.1 86.8 87.4  PLT 124* 120* 90*    Cardiac Enzymes: No results for input(s): CKTOTAL, CKMB, CKMBINDEX, TROPONINI in the last 168 hours.  BNP: Invalid input(s): POCBNP  CBG: No results for input(s): GLUCAP in the last 168 hours.  Microbiology: No results found for this or any previous visit.  Coagulation Studies: No results for input(s): LABPROT, INR in the last 72 hours.  Urinalysis: No results for input(s): COLORURINE, LABSPEC, PHURINE,  GLUCOSEU, HGBUR, BILIRUBINUR, KETONESUR, PROTEINUR, UROBILINOGEN, NITRITE, LEUKOCYTESUR in the last 72 hours.  Invalid input(s): APPERANCEUR    Imaging: No results found.   Medications:       Assessment/ Plan:  70 y.o. 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  1. Acute renal failure/proteinuria. Baseline creatinine 1.0. Acute renal failure secondary to sepsis most likely.  - Pt underwent HD today, will continue to monitor renal parameters. - UOP 1825 cc last 24 hrs - continue to monitor UOP closely - liberal fluid intake; Start lasix 40 BID - short HD treatment on Wednesday  2. Acute respiratory failure.  - The patient continues to be doing well off the ventilator.   3. Anemia not otherwise specified.  - Continue to periodically monitor hemoglobin. Most recent hemoglobin was 9.0.  4.  Secondary hyperparathyroidism: last phos was 5.5,  Monitor periodically.   Avoid binders for now.    LOS: 0 Trinity Hyland 11/27/20174:20 PM

## 2016-08-15 DIAGNOSIS — J9690 Respiratory failure, unspecified, unspecified whether with hypoxia or hypercapnia: Secondary | ICD-10-CM

## 2016-08-15 DIAGNOSIS — J9600 Acute respiratory failure, unspecified whether with hypoxia or hypercapnia: Secondary | ICD-10-CM | POA: Diagnosis not present

## 2016-08-15 LAB — BASIC METABOLIC PANEL
Anion gap: 6 (ref 5–15)
BUN: 51 mg/dL — AB (ref 6–20)
CALCIUM: 7.7 mg/dL — AB (ref 8.9–10.3)
CO2: 30 mmol/L (ref 22–32)
CREATININE: 2.2 mg/dL — AB (ref 0.61–1.24)
Chloride: 102 mmol/L (ref 101–111)
GFR calc Af Amer: 33 mL/min — ABNORMAL LOW (ref >60)
GFR, EST NON AFRICAN AMERICAN: 29 mL/min — AB (ref >60)
GLUCOSE: 113 mg/dL — AB (ref 65–99)
POTASSIUM: 4.6 mmol/L (ref 3.5–5.1)
SODIUM: 138 mmol/L (ref 135–145)

## 2016-08-15 NOTE — Progress Notes (Signed)
Name: Zachary Galvan MRN: [id number] DOB: 09-09-1946    ADMISSION DATE:  08/05/2016 CONSULTATION DATE: 08/07/16   REFERRING MD :  Dr. 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 He had developed acute kidney injury in the setting of sepsis and acute respiratory failure. He was placed on BiPAP but failed requiring intubation. Subsequently was extubated and failed a second time requiring reintubation. Patient required hemodialysis for acute kidney injury.  He also was reportedly found to have a fungal UTI and was treated accordingly.  Course further complicated by hematuria and anticoagulation was held.  [facility]   At baseline, family reports he is on spiriva, symbicort, prednisone, nocturnal bipap and O2 3l [place] 10 mg and PRN nebulizer's.   SUBJECTIVE:  Pt extubated last week.  Tolerated well.  Wearing Trilogy (home unit) QHS.  2L O2 during daytime.  No acute events.   VITAL SIGNS: Vital signs reviewed. Abnormal values will appear under impression plan section.   PHYSICAL EXAMINATION: General:  Obese male in NAD   Neuro:  Awake/alert, MAE, communicates appropriately   HEENT: MM pink/moist, [place] O2 Cardiovascular:  s1s2 rrr, no m/r/g  Lungs:  Even/non-labored, lungs bilaterally clear, diminished lower     Abdomen:  Obese/soft, bsx4 active  Musculoskeletal:  No acute deformities  Skin:  Warm/dry, no edema    Recent Labs Lab 08/12/16 0500 08/14/16 0700 08/15/16 0605  NA 135 138 138  K 4.5 5.2* 4.6  CL 96* 98* 102  CO2 30 30 30   BUN 60* 97*  51*  CREATININE 2.44* 3.02* 2.20*  GLUCOSE 183* 174* 113*     Recent Labs Lab 08/12/16 0500 08/14/16 0500  HGB 10.3* 9.6*  HCT 32.1* 29.7*  WBC 15.2* 10.7*  PLT 120* 90*    No results found.    SIGNIFICANT EVENTS  11/18  [facility]  11/22  Extubated   STUDIES:  LUE Duplex 11/21 >>neg   ASSESSMENT / PLAN:  Discussion:  70 y/o M with O2 + pred dependent COPD with recent admit for klebsiella bacteremia secondary to pyelonephritis with associated sepsis, AKI, prolonged vent needs.     Acute on Chronic Respiratory Failure O2 + Prednisone Dependent COPD - baseline 10 mg pred dependent  OSA / OHS Recent Klebsiella UTI / Bacteremia   Plan: Aggressive pulmonary hygiene - IS, mobilize Mandatory nocturnal BiPAP O2 sat goal 88-94% Continue pulmicort, brovana + Q6 atrovent Wean steroids slowly, was on prednisone prior to admit.  Consider change to 40 mg QD with reduction by 10 mg Q week and assess response Minimize sedating medications, recommend weaning klonopin to off    LUE Swelling : improved  Plan: Venous Duplex neg  Nausea / Vomiting - resolved  Plan: Per primary SVC  PCCM will sign off, please call if we can be of further assistance.   [name], [name] [facility] Pgr: [phone] or if no answer [phone] 08/15/2016, 9:28 AM  Attending Note:  I have examined patient, reviewed labs, studies and notes. I have discussed the case with B Ollis, and I agree with the data and  plans as amended above. Pt successfully extubated. He is using Trilogy at night reliably. Will continue w this plan. Please call if we can assist.    Levy Pupaobert Ichelle Harral, MD, PhD 08/15/2016, 1:59 PM [facility] [phone] or if no answer [phone]

## 2016-08-16 LAB — RENAL FUNCTION PANEL
ALBUMIN: 2.6 g/dL — AB (ref 3.5–5.0)
ANION GAP: 9 (ref 5–15)
BUN: 67 mg/dL — AB (ref 6–20)
CALCIUM: 7.5 mg/dL — AB (ref 8.9–10.3)
CO2: 29 mmol/L (ref 22–32)
Chloride: 100 mmol/L — ABNORMAL LOW (ref 101–111)
Creatinine, Ser: 2.32 mg/dL — ABNORMAL HIGH (ref 0.61–1.24)
GFR calc Af Amer: 31 mL/min — ABNORMAL LOW (ref >60)
GFR, EST NON AFRICAN AMERICAN: 27 mL/min — AB (ref >60)
GLUCOSE: 252 mg/dL — AB (ref 65–99)
PHOSPHORUS: 4.2 mg/dL (ref 2.5–4.6)
POTASSIUM: 4.2 mmol/L (ref 3.5–5.1)
SODIUM: 138 mmol/L (ref 135–145)

## 2016-08-16 LAB — CBC
HEMATOCRIT: 33.2 % — AB (ref 39.0–52.0)
HEMOGLOBIN: 9.9 g/dL — AB (ref 13.0–17.0)
MCH: 29.7 pg (ref 26.0–34.0)
MCHC: 29.8 g/dL — AB (ref 30.0–36.0)
MCV: 99.7 fL (ref 78.0–100.0)
Platelets: 325 10*3/uL (ref 150–400)
RBC: 3.33 MIL/uL — ABNORMAL LOW (ref 4.22–5.81)
RDW: 15.8 % — ABNORMAL HIGH (ref 11.5–15.5)
WBC: 9.5 10*3/uL (ref 4.0–10.5)

## 2016-08-16 NOTE — Progress Notes (Signed)
  [facility]  ROUNDING NOTE   Subjective:  Pt seen at bedside. Appears to be doing well  Cr 2.20, EGFR 29  Last HD was on Monday 11/27 UOP 1600 last 24 hrs No c/o SOB; eating well. Wife at bedside   Objective:  Vital signs in last 24 hours:  Temperature 97.2, pulse 64, respirations 30, blood pressure 120/61   Physical Exam: General: No acute distress  Head:  Moist oral mucosal membranes  Eyes: Anicteric  Neck: Supple,   Lungs:  Scattered rhonchi, normal effort  Heart: S1S2 no rubs  Abdomen:  Soft, nontender,  Extremities: trace peripheral edema.  Neurologic: Nonfocal, moving all four extremities  Skin: No lesions  Access: L IJ dialysis catheter    Basic Metabolic Panel:  Recent Labs Lab 08/12/16 0500 08/14/16 0700 08/15/16 0605  NA 135 138 138  K 4.5 5.2* 4.6  CL 96* 98* 102  CO2 '30 30 30  '$ GLUCOSE 183* 174* 113*  BUN 60* 97* 51*  CREATININE 2.44* 3.02* 2.20*  CALCIUM 7.9* 7.9* 7.7*  PHOS  --  5.5*  --     Liver Function Tests:  Recent Labs Lab 08/14/16 0700  ALBUMIN 3.0*   No results for input(s): LIPASE, AMYLASE in the last 168 hours. No results for input(s): AMMONIA in the last 168 hours.  CBC:  Recent Labs Lab 08/12/16 0500 08/14/16 0500 08/16/16 0650  WBC 15.2* 10.7* 9.5  HGB 10.3* 9.6* 9.9*  HCT 32.1* 29.7* 33.2*  MCV 86.8 87.4 99.7  PLT 120* 90* 325    Cardiac Enzymes: No results for input(s): CKTOTAL, CKMB, CKMBINDEX, TROPONINI in the last 168 hours.  BNP: Invalid input(s): POCBNP  CBG: No results for input(s): GLUCAP in the last 168 hours.  Microbiology: No results found for this or any previous visit.  Coagulation Studies: No results for input(s): LABPROT, INR in the last 72 hours.  Urinalysis: No results for input(s): COLORURINE, LABSPEC, PHURINE, GLUCOSEU, HGBUR, BILIRUBINUR, KETONESUR, PROTEINUR, UROBILINOGEN, NITRITE, LEUKOCYTESUR in the last 72 hours.  Invalid input(s): APPERANCEUR     Imaging: No results found.   Medications:       Assessment/ Plan:  70 y.o. 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  1. Acute renal failure/proteinuria. Baseline creatinine 1.0. Acute renal failure secondary to sepsis most likely.  - Pt underwent HD today, will continue to monitor renal parameters. - UOP 1600cc last 24 hrs - continue to monitor UOP closely - liberal fluid intake; lasix 40 BID - hold futher HD  - If S Cr continues to improve, will d/c dialysis cathter  2. Acute respiratory failure.  - improved  3. Anemia not otherwise specified.  - Continue to periodically monitor hemoglobin. Most recent hemoglobin was 9.9.  4. hyperphosphatemia.  - monitor    LOS: 0 Kapil Petropoulos 11/29/20179:13 AM

## 2016-08-17 ENCOUNTER — Other Ambulatory Visit (HOSPITAL_COMMUNITY): Payer: Medicare Other

## 2016-08-17 LAB — BASIC METABOLIC PANEL
ANION GAP: 11 (ref 5–15)
BUN: 72 mg/dL — ABNORMAL HIGH (ref 6–20)
CALCIUM: 7.7 mg/dL — AB (ref 8.9–10.3)
CO2: 30 mmol/L (ref 22–32)
Chloride: 96 mmol/L — ABNORMAL LOW (ref 101–111)
Creatinine, Ser: 2.55 mg/dL — ABNORMAL HIGH (ref 0.61–1.24)
GFR calc Af Amer: 28 mL/min — ABNORMAL LOW (ref >60)
GFR, EST NON AFRICAN AMERICAN: 24 mL/min — AB (ref >60)
GLUCOSE: 131 mg/dL — AB (ref 65–99)
Potassium: 4.3 mmol/L (ref 3.5–5.1)
SODIUM: 137 mmol/L (ref 135–145)

## 2016-08-17 LAB — BLOOD GAS, ARTERIAL
Acid-Base Excess: 6.1 mmol/L — ABNORMAL HIGH (ref 0.0–2.0)
Bicarbonate: 32 mmol/L — ABNORMAL HIGH (ref 20.0–28.0)
O2 Content: 2 L/min
O2 Saturation: 96.8 %
PCO2 ART: 64.2 mmHg — AB (ref 32.0–48.0)
PH ART: 7.318 — AB (ref 7.350–7.450)
Patient temperature: 98.6
pO2, Arterial: 88.4 mmHg (ref 83.0–108.0)

## 2016-08-18 ENCOUNTER — Other Ambulatory Visit (HOSPITAL_COMMUNITY): Payer: Medicare Other

## 2016-08-18 LAB — RENAL FUNCTION PANEL
ALBUMIN: 2.8 g/dL — AB (ref 3.5–5.0)
ANION GAP: 14 (ref 5–15)
Albumin: 2.5 g/dL — ABNORMAL LOW (ref 3.5–5.0)
Anion gap: 10 (ref 5–15)
BUN: 71 mg/dL — AB (ref 6–20)
BUN: 70 mg/dL — AB (ref 6–20)
CALCIUM: 7.9 mg/dL — AB (ref 8.9–10.3)
CALCIUM: 7.8 mg/dL — AB (ref 8.9–10.3)
CO2: 31 mmol/L (ref 22–32)
CO2: 32 mmol/L (ref 22–32)
CREATININE: 2.42 mg/dL — AB (ref 0.61–1.24)
Chloride: 94 mmol/L — ABNORMAL LOW (ref 101–111)
Chloride: 95 mmol/L — ABNORMAL LOW (ref 101–111)
Creatinine, Ser: 2.33 mg/dL — ABNORMAL HIGH (ref 0.61–1.24)
GFR calc Af Amer: 31 mL/min — ABNORMAL LOW (ref >60)
GFR calc non Af Amer: 26 mL/min — ABNORMAL LOW (ref >60)
GFR, EST AFRICAN AMERICAN: 30 mL/min — AB (ref >60)
GFR, EST NON AFRICAN AMERICAN: 27 mL/min — AB (ref >60)
Glucose, Bld: 43 mg/dL — CL (ref 65–99)
Glucose, Bld: 125 mg/dL — ABNORMAL HIGH (ref 65–99)
PHOSPHORUS: 3.3 mg/dL (ref 2.5–4.6)
POTASSIUM: 3.4 mmol/L — AB (ref 3.5–5.1)
Phosphorus: 4 mg/dL (ref 2.5–4.6)
Potassium: 4.1 mmol/L (ref 3.5–5.1)
SODIUM: 139 mmol/L (ref 135–145)
SODIUM: 137 mmol/L (ref 135–145)

## 2016-08-18 NOTE — Progress Notes (Signed)
[facility]  ROUNDING NOTE   Subjective:  Pt seen at bedside. Wife reports that he is not doing well today. Appears to be short of breath Blood sugar was low this morning CXR showed pulmonary venous congestion Given iv lasix last night  This morning also has fever  Cr 2.33, EGFR 27  Last HD was on Monday 11/27 UOP > 2700 last 24 hrs    Objective:  Vital signs in last 24 hours:  Temperature 98.1, pulse 60 paced, respirations 30, blood pressure 122/62   Physical Exam: General: No acute distress  Head:  normocephalic  Eyes/ENT: Anicteric, Moist oral mucosal membranes  Neck: Supple,   Lungs:  Scattered rhonchi, basilar crackles, some tachypnea  Heart: S1S2 no rubs  Abdomen:  Soft, nontender,  Extremities: trace peripheral edema.  Neurologic: Alert, follows simple commands  Skin: No lesions  Access: L IJ dialysis catheter    Basic Metabolic Panel:  Recent Labs Lab 08/14/16 0700 08/15/16 0605 08/16/16 0915 08/17/16 1553 08/18/16 0632  NA 138 138 138 137 139  K 5.2* 4.6 4.2 4.3 3.4*  CL 98* 102 100* 96* 94*  CO2 30 30 29 30 31   GLUCOSE 174* 113* 252* 131* 43*  BUN 97* 51* 67* 72* 71*  CREATININE 3.02* 2.20* 2.32* 2.55* 2.33*  CALCIUM 7.9* 7.7* 7.5* 7.7* 7.9*  PHOS 5.5*  --  4.2  --  3.3    Liver Function Tests:  Recent Labs Lab 08/14/16 0700 08/16/16 0915 08/18/16 0632  ALBUMIN 3.0* 2.6* 2.8*   No results for input(s): LIPASE, AMYLASE in the last 168 hours. No results for input(s): AMMONIA in the last 168 hours.  CBC:  Recent Labs Lab 08/12/16 0500 08/14/16 0500 08/16/16 0650  WBC 15.2* 10.7* 9.5  HGB 10.3* 9.6* 9.9*  HCT 32.1* 29.7* 33.2*  MCV 86.8 87.4 99.7  PLT 120* 90* 325    Cardiac Enzymes: No results for input(s): CKTOTAL, CKMB, CKMBINDEX, TROPONINI in the last 168 hours.  BNP: Invalid input(s): POCBNP  CBG: No results for input(s): GLUCAP in the last 168 hours.  Microbiology: No results found for this or any  previous visit.  Coagulation Studies: No results for input(s): LABPROT, INR in the last 72 hours.  Urinalysis: No results for input(s): COLORURINE, LABSPEC, PHURINE, GLUCOSEU, HGBUR, BILIRUBINUR, KETONESUR, PROTEINUR, UROBILINOGEN, NITRITE, LEUKOCYTESUR in the last 72 hours.  Invalid input(s): APPERANCEUR    Imaging: Dg Chest Port 1 View  Result Date: 08/17/2016 CLINICAL DATA:  Respiratory failure, acute on chronic shortness of breath. EXAM: PORTABLE CHEST 1 VIEW COMPARISON:  Chest radiograph August 08, 2016 FINDINGS: The cardiac silhouette is mildly enlarged. Mediastinal silhouette is nonsuspicious, calcified aortic knob. Pulmonary vascular congestion and diffuse mild interstitial prominence. No pleural effusion or focal consolidation. No pneumothorax. LEFT cardiac defibrillator in situ. LEFT dialysis catheter with distal tip projecting in superior vena cava. Interval extubation. No pneumothorax. Soft tissue planes and included osseous structures are nonsuspicious. IMPRESSION: Stable cardiomegaly. Pulmonary vascular congestion and mild interstitial prominence most compatible with pulmonary edema. Extubation, stable position of LEFT dialysis catheter. Electronically Signed   By: Elon Alas M.D.   On: 08/17/2016 17:13     Medications:       Assessment/ Plan:  70 y.o. 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  1. Acute renal  failure/proteinuria. Baseline creatinine 1.0. Acute renal failure secondary to sepsis most likely.    - UOP 2700 cc last 24 hrs - continue to monitor UOP closely -- change Lasix to 40 BID iv  - hold futher HD  - If S Cr continues to improve, will d/c dialysis cathter  2. Shortness of  breath  - in context of fever, worrisome for pneumonia - CXR shows pulmonary venous congestion- given iv lasix  - agree with sputum cultures - repeat CXR  3. Anemia not otherwise specified.  - Continue to periodically monitor hemoglobin. Most recent hemoglobin was 9.9.  4. hyperphosphatemia.  - monitor  - currently 3.3  5. Recommend D10 for hypoglycemia - Avoid NS or 1/2 normal saline   LOS: 0 Anahla Bevis 12/1/20179:21 AM

## 2016-08-19 LAB — BASIC METABOLIC PANEL
Anion gap: 10 (ref 5–15)
BUN: 72 mg/dL — AB (ref 6–20)
CALCIUM: 7.9 mg/dL — AB (ref 8.9–10.3)
CHLORIDE: 97 mmol/L — AB (ref 101–111)
CO2: 34 mmol/L — AB (ref 22–32)
CREATININE: 2.42 mg/dL — AB (ref 0.61–1.24)
GFR calc non Af Amer: 26 mL/min — ABNORMAL LOW (ref >60)
GFR, EST AFRICAN AMERICAN: 30 mL/min — AB (ref >60)
GLUCOSE: 128 mg/dL — AB (ref 65–99)
Potassium: 3.8 mmol/L (ref 3.5–5.1)
Sodium: 141 mmol/L (ref 135–145)

## 2016-08-21 LAB — BASIC METABOLIC PANEL
Anion gap: 15 (ref 5–15)
BUN: 74 mg/dL — AB (ref 6–20)
CALCIUM: 8.5 mg/dL — AB (ref 8.9–10.3)
CO2: 33 mmol/L — AB (ref 22–32)
CREATININE: 2.25 mg/dL — AB (ref 0.61–1.24)
Chloride: 90 mmol/L — ABNORMAL LOW (ref 101–111)
GFR calc Af Amer: 32 mL/min — ABNORMAL LOW (ref >60)
GFR, EST NON AFRICAN AMERICAN: 28 mL/min — AB (ref >60)
GLUCOSE: 128 mg/dL — AB (ref 65–99)
Potassium: 4.4 mmol/L (ref 3.5–5.1)
Sodium: 138 mmol/L (ref 135–145)

## 2016-08-21 LAB — BLOOD GAS, ARTERIAL
Acid-Base Excess: 12 mmol/L — ABNORMAL HIGH (ref 0.0–2.0)
BICARBONATE: 38.8 mmol/L — AB (ref 20.0–28.0)
O2 Content: 2 L/min
O2 Saturation: 92.5 %
PCO2 ART: 84.6 mmHg — AB (ref 32.0–48.0)
PH ART: 7.284 — AB (ref 7.350–7.450)
Patient temperature: 98.6
pO2, Arterial: 68.9 mmHg — ABNORMAL LOW (ref 83.0–108.0)

## 2016-08-21 NOTE — Progress Notes (Signed)
  [facility]  ROUNDING NOTE   Subjective:  Pt seen at bedside. He is doing overall better Diary Thedore MinsSingh well Tooth creatinine 2.25  BUN 74 Urine output 1100 cc reported   Objective:  Vital signs in last 24 hours:  Temperature 98.1, pulse 60 paced, respirations 32-36 blood pressure 122/69 -  Physical Exam: General: No acute distress  Head:  normocephalic  Eyes/ENT: Anicteric, Moist oral mucosal membranes  Neck: Supple,   Lungs:  Scattered rhonchi, basilar crackles, some tachypnea  Heart: S1S2 no rubs  Abdomen:  Soft, nontender,  Extremities: trace peripheral edema.  Neurologic: Alert, follows simple commands  Skin: No lesions  Access: L IJ dialysis catheter    Basic Metabolic Panel:  Recent Labs Lab 08/16/16 0915 08/17/16 1553 08/18/16 0632 08/18/16 1144 08/19/16 0500 08/21/16 0524  NA 138 137 139 137 141 138  K 4.2 4.3 3.4* 4.1 3.8 4.4  CL 100* 96* 94* 95* 97* 90*  CO2 29 30 31  32 34* 33*  GLUCOSE 252* 131* 43* 125* 128* 128*  BUN 67* 72* 71* 70* 72* 74*  CREATININE 2.32* 2.55* 2.33* 2.42* 2.42* 2.25*  CALCIUM 7.5* 7.7* 7.9* 7.8* 7.9* 8.5*  PHOS 4.2  --  3.3 4.0  --   --     Liver Function Tests:  Recent Labs Lab 08/16/16 0915 08/18/16 0632 08/18/16 1144  ALBUMIN 2.6* 2.8* 2.5*   No results for input(s): LIPASE, AMYLASE in the last 168 hours. No results for input(s): AMMONIA in the last 168 hours.  CBC:  Recent Labs Lab 08/16/16 0650  WBC 9.5  HGB 9.9*  HCT 33.2*  MCV 99.7  PLT 325    Cardiac Enzymes: No results for input(s): CKTOTAL, CKMB, CKMBINDEX, TROPONINI in the last 168 hours.  BNP: Invalid input(s): POCBNP  CBG: No results for input(s): GLUCAP in the last 168 hours.  Microbiology: No results found for this or any previous visit.  Coagulation Studies: No results for input(s): LABPROT, INR in the last 72 hours.  Urinalysis: No results for input(s): COLORURINE, LABSPEC, PHURINE, GLUCOSEU, HGBUR, BILIRUBINUR,  KETONESUR, PROTEINUR, UROBILINOGEN, NITRITE, LEUKOCYTESUR in the last 72 hours.  Invalid input(s): APPERANCEUR    Imaging: No results found.   Medications:       Assessment/ Plan:  70 y.o. 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  1. Acute renal failure/proteinuria. Baseline creatinine 1.0. Acute renal failure secondary to sepsis most likely.  Overall doing fair, continues to diurese well  -continue Lasix 40 BID iv for now - hold futher HD  - If S Cr continues to improve, will d/c dialysis cathter  2. Shortness of breath  Improved with diuresis  3. Anemia not otherwise specified.  - Continue to periodically monitor hemoglobin. Most recent hemoglobin was 9.9.  4. hyperphosphatemia.  - monitor  - currently     LOS: 0 Shaquel Josephson 12/4/201711:24 AM

## 2016-08-22 ENCOUNTER — Other Ambulatory Visit (HOSPITAL_COMMUNITY): Payer: Medicare Other

## 2016-08-22 LAB — BLOOD GAS, ARTERIAL
Acid-Base Excess: 12.6 mmol/L — ABNORMAL HIGH (ref 0.0–2.0)
BICARBONATE: 37.6 mmol/L — AB (ref 20.0–28.0)
DELIVERY SYSTEMS: POSITIVE
Expiratory PAP: 8
FIO2: 30
Inspiratory PAP: 16
Mode: POSITIVE
O2 SAT: 93.3 %
PATIENT TEMPERATURE: 98.6
PO2 ART: 63.6 mmHg — AB (ref 83.0–108.0)
pCO2 arterial: 58.2 mmHg — ABNORMAL HIGH (ref 32.0–48.0)
pH, Arterial: 7.426 (ref 7.350–7.450)

## 2016-08-22 LAB — HEPATIC FUNCTION PANEL
ALK PHOS: 65 U/L (ref 38–126)
ALT: 35 U/L (ref 17–63)
AST: 25 U/L (ref 15–41)
Albumin: 2.2 g/dL — ABNORMAL LOW (ref 3.5–5.0)
BILIRUBIN INDIRECT: 0.4 mg/dL (ref 0.3–0.9)
Bilirubin, Direct: 0.2 mg/dL (ref 0.1–0.5)
TOTAL PROTEIN: 4.9 g/dL — AB (ref 6.5–8.1)
Total Bilirubin: 0.6 mg/dL (ref 0.3–1.2)

## 2016-08-23 NOTE — Progress Notes (Signed)
[facility]  ROUNDING NOTE   Subjective:  Pt seen at bedside. He is doing overall fair. Denies acute SOB No new labs today     Objective:  Vital signs in last 24 hours:  Temperature 98.2, pulse 60 paced, respirations 25 blood pressure 135/62 -  Physical Exam: General: No acute distress  Head:  normocephalic  Eyes/ENT: Anicteric, Moist oral mucosal membranes  Neck: Supple,   Lungs:  Scattered rhonchi, basilar crackles, some tachypnea  Heart: S1S2 no rubs  Abdomen:  Soft, nontender,  Extremities: trace peripheral edema.  Neurologic: Alert, follows simple commands  Skin: No lesions  Access: L IJ dialysis catheter    Basic Metabolic Panel:  Recent Labs Lab 08/17/16 1553 08/18/16 0632 08/18/16 1144 08/19/16 0500 08/21/16 0524  NA 137 139 137 141 138  K 4.3 3.4* 4.1 3.8 4.4  CL 96* 94* 95* 97* 90*  CO2 30 31 32 34* 33*  GLUCOSE 131* 43* 125* 128* 128*  BUN 72* 71* 70* 72* 74*  CREATININE 2.55* 2.33* 2.42* 2.42* 2.25*  CALCIUM 7.7* 7.9* 7.8* 7.9* 8.5*  PHOS  --  3.3 4.0  --   --     Liver Function Tests:  Recent Labs Lab 08/18/16 0632 08/18/16 1144 08/22/16 1138  AST  --   --  25  ALT  --   --  35  ALKPHOS  --   --  65  BILITOT  --   --  0.6  PROT  --   --  4.9*  ALBUMIN 2.8* 2.5* 2.2*   No results for input(s): LIPASE, AMYLASE in the last 168 hours. No results for input(s): AMMONIA in the last 168 hours.  CBC: No results for input(s): WBC, NEUTROABS, HGB, HCT, MCV, PLT in the last 168 hours.  Cardiac Enzymes: No results for input(s): CKTOTAL, CKMB, CKMBINDEX, TROPONINI in the last 168 hours.  BNP: Invalid input(s): POCBNP  CBG: No results for input(s): GLUCAP in the last 168 hours.  Microbiology: No results found for this or any previous visit.  Coagulation Studies: No results for input(s): LABPROT, INR in the last 72 hours.  Urinalysis: No results for input(s): COLORURINE, LABSPEC, PHURINE, GLUCOSEU, HGBUR, BILIRUBINUR,  KETONESUR, PROTEINUR, UROBILINOGEN, NITRITE, LEUKOCYTESUR in the last 72 hours.  Invalid input(s): APPERANCEUR    Imaging: [place] Abdomen Complete  Result Date: 08/22/2016 CLINICAL DATA:  Abdominal pain, obesity, hypertension, diabetes EXAM: ABDOMEN ULTRASOUND COMPLETE COMPARISON:  Abdomen film of 08/10/2016 FINDINGS: Gallbladder: The gallbladder is visualized and multiple echo densities are present which are not mobile and do not shadow, most consistent with small polyps, the largest of 5 mm in diameter. No definite gallstones are seen, and there is no pain over the gallbladder with compression. Common bile duct: Diameter: The common bile duct measures 4.7 mm in diameter. Liver: The liver is inhomogeneous and somewhat difficult to assess in this large patient no focal hepatic abnormality is seen. IVC: No abnormality visualized. Pancreas: The pancreas is largely obscured by bowel gas. The spleen measures 9.6 cm. Spleen: Size and appearance within normal limits. Right Kidney: Length: 15.1 cm. No hydronephrosis is seen. There is a cyst present of 3.3 cm. Left Kidney: Length: 14.2 cm. No hydronephrosis is noted. A cyst is present of 3.8 cm. Abdominal aorta: Abdominal aorta is not well seen being obscured by bowel gas. No focal aneurysmal dilatation is evident. Other findings: None. IMPRESSION: 1. Multiple gallbladder polyps.  No gallstones. 2. Bilateral renal cysts.  No hydronephrosis. 3. Portions of the  pancreas and part of the abdominal aorta are obscured by bowel gas. 4. Inhomogeneous liver may indicate fatty infiltration. Electronically Signed   By: Dwyane DeePaul  Barry M.D.   On: 08/22/2016 17:00   Dg Chest Port 1 View  Result Date: 08/22/2016 CLINICAL DATA:  Respiratory failure. EXAM: PORTABLE CHEST 1 VIEW COMPARISON:  08/18/2016 FINDINGS: An ICD remains in place, with leads incompletely evaluated. Left jugular catheter projects over the SVC. Cardiomediastinal silhouette is unchanged. There is persistent pulmonary  vascular congestion with diffusely increased interstitial markings bilaterally, overall mildly worsened from the prior study. No sizable pleural effusion or pneumothorax is identified. IMPRESSION: Mild worsening of pulmonary edema. Electronically Signed   By: Sebastian AcheAllen  Grady M.D.   On: 08/22/2016 08:23     Medications:       Assessment/ Plan:  70 y.o. 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  1. Acute renal failure/proteinuria. Baseline creatinine 1.0. Acute renal failure secondary to sepsis most likely.  Overall doing fair, continues to diurese well  -continue Lasix 40 BID iv for now - hold futher HD  - If S Cr continues to improve, will d/c dialysis cathter - monitor electrolytes closely  2. Shortness of breath  Improved with diuresis Prednisone taper      LOS: 0 Mariame Rybolt 12/6/20174:15 PM

## 2016-08-24 LAB — RENAL FUNCTION PANEL
Albumin: 2.2 g/dL — ABNORMAL LOW (ref 3.5–5.0)
Anion gap: 13 (ref 5–15)
BUN: 69 mg/dL — ABNORMAL HIGH (ref 6–20)
CO2: 36 mmol/L — ABNORMAL HIGH (ref 22–32)
Calcium: 8.8 mg/dL — ABNORMAL LOW (ref 8.9–10.3)
Chloride: 93 mmol/L — ABNORMAL LOW (ref 101–111)
Creatinine, Ser: 2.25 mg/dL — ABNORMAL HIGH (ref 0.61–1.24)
GFR calc Af Amer: 32 mL/min — ABNORMAL LOW (ref >60)
GFR calc non Af Amer: 28 mL/min — ABNORMAL LOW (ref >60)
Glucose, Bld: 196 mg/dL — ABNORMAL HIGH (ref 65–99)
Phosphorus: 2.5 mg/dL (ref 2.5–4.6)
Potassium: 3.5 mmol/L (ref 3.5–5.1)
Sodium: 142 mmol/L (ref 135–145)

## 2016-08-25 LAB — BASIC METABOLIC PANEL
Anion gap: 12 (ref 5–15)
BUN: 72 mg/dL — ABNORMAL HIGH (ref 6–20)
CHLORIDE: 92 mmol/L — AB (ref 101–111)
CO2: 38 mmol/L — ABNORMAL HIGH (ref 22–32)
Calcium: 8.8 mg/dL — ABNORMAL LOW (ref 8.9–10.3)
Creatinine, Ser: 2.41 mg/dL — ABNORMAL HIGH (ref 0.61–1.24)
GFR calc non Af Amer: 26 mL/min — ABNORMAL LOW (ref >60)
GFR, EST AFRICAN AMERICAN: 30 mL/min — AB (ref >60)
Glucose, Bld: 138 mg/dL — ABNORMAL HIGH (ref 65–99)
POTASSIUM: 3.4 mmol/L — AB (ref 3.5–5.1)
SODIUM: 142 mmol/L (ref 135–145)

## 2016-08-26 LAB — BASIC METABOLIC PANEL
ANION GAP: 9 (ref 5–15)
BUN: 71 mg/dL — ABNORMAL HIGH (ref 6–20)
CALCIUM: 8.8 mg/dL — AB (ref 8.9–10.3)
CO2: 43 mmol/L — AB (ref 22–32)
Chloride: 91 mmol/L — ABNORMAL LOW (ref 101–111)
Creatinine, Ser: 2.22 mg/dL — ABNORMAL HIGH (ref 0.61–1.24)
GFR, EST AFRICAN AMERICAN: 33 mL/min — AB (ref >60)
GFR, EST NON AFRICAN AMERICAN: 28 mL/min — AB (ref >60)
GLUCOSE: 152 mg/dL — AB (ref 65–99)
POTASSIUM: 3.3 mmol/L — AB (ref 3.5–5.1)
Sodium: 143 mmol/L (ref 135–145)

## 2016-08-26 LAB — URINALYSIS, ROUTINE W REFLEX MICROSCOPIC
BILIRUBIN URINE: NEGATIVE
Glucose, UA: NEGATIVE mg/dL
Ketones, ur: NEGATIVE mg/dL
NITRITE: NEGATIVE
PH: 5 (ref 5.0–8.0)
Protein, ur: 30 mg/dL — AB
SPECIFIC GRAVITY, URINE: 1.011 (ref 1.005–1.030)

## 2016-08-26 LAB — CBC
HCT: 25.3 % — ABNORMAL LOW (ref 39.0–52.0)
Hemoglobin: 7.6 g/dL — ABNORMAL LOW (ref 13.0–17.0)
MCH: 28.1 pg (ref 26.0–34.0)
MCHC: 30 g/dL (ref 30.0–36.0)
MCV: 93.7 fL (ref 78.0–100.0)
PLATELETS: 102 10*3/uL — AB (ref 150–400)
RBC: 2.7 MIL/uL — AB (ref 4.22–5.81)
RDW: 15.2 % (ref 11.5–15.5)
WBC: 5.5 10*3/uL (ref 4.0–10.5)

## 2016-08-26 LAB — AMMONIA: AMMONIA: 46 umol/L — AB (ref 9–35)

## 2016-08-26 LAB — DIGOXIN LEVEL: DIGOXIN LVL: 1.8 ng/mL (ref 0.8–2.0)

## 2016-08-26 LAB — MAGNESIUM: Magnesium: 1.7 mg/dL (ref 1.7–2.4)

## 2016-08-27 LAB — BLOOD GAS, VENOUS
ACID-BASE EXCESS: 14.1 mmol/L — AB (ref 0.0–2.0)
BICARBONATE: 41.1 mmol/L — AB (ref 20.0–28.0)
O2 Saturation: 61.8 %
PATIENT TEMPERATURE: 98.6
pCO2, Ven: 88.5 mmHg (ref 44.0–60.0)
pH, Ven: 7.288 (ref 7.250–7.430)
pO2, Ven: 35 mmHg (ref 32.0–45.0)

## 2016-08-28 LAB — CBC
HEMATOCRIT: 26.7 % — AB (ref 39.0–52.0)
HEMOGLOBIN: 8.1 g/dL — AB (ref 13.0–17.0)
MCH: 28.4 pg (ref 26.0–34.0)
MCHC: 30.3 g/dL (ref 30.0–36.0)
MCV: 93.7 fL (ref 78.0–100.0)
Platelets: 144 10*3/uL — ABNORMAL LOW (ref 150–400)
RBC: 2.85 MIL/uL — ABNORMAL LOW (ref 4.22–5.81)
RDW: 15.1 % (ref 11.5–15.5)
WBC: 7.2 10*3/uL (ref 4.0–10.5)

## 2016-08-28 LAB — URINE CULTURE

## 2016-08-28 LAB — BASIC METABOLIC PANEL
ANION GAP: 12 (ref 5–15)
BUN: 60 mg/dL — ABNORMAL HIGH (ref 6–20)
CHLORIDE: 93 mmol/L — AB (ref 101–111)
CO2: 39 mmol/L — ABNORMAL HIGH (ref 22–32)
Calcium: 8.8 mg/dL — ABNORMAL LOW (ref 8.9–10.3)
Creatinine, Ser: 2.14 mg/dL — ABNORMAL HIGH (ref 0.61–1.24)
GFR calc non Af Amer: 30 mL/min — ABNORMAL LOW (ref >60)
GFR, EST AFRICAN AMERICAN: 35 mL/min — AB (ref >60)
GLUCOSE: 108 mg/dL — AB (ref 65–99)
Potassium: 3.7 mmol/L (ref 3.5–5.1)
Sodium: 144 mmol/L (ref 135–145)

## 2016-08-28 NOTE — Progress Notes (Signed)
[facility]  ROUNDING NOTE   Subjective:  Patient resting comfortably in bed. Wife reports periodic confusion. Creatinine slightly down to 2.1.     Objective:  Vital signs in last 24 hours:  Temperature 98.5 pulse 77 respirations 28 blood pressure 154/74 -  Physical Exam: General: No acute distress  Head: normocephalic  Eyes/ENT: Anicteric, Moist oral mucosal membranes  Neck: Supple  Lungs:  Scattered rhonchi, basilar crackles  Heart: S1S2 no rubs  Abdomen:  Soft, nontender  Extremities: trace peripheral edema.  Neurologic: Alert, follows simple commands  Skin: No lesions  Access: L IJ dialysis catheter    Basic Metabolic Panel:  Recent Labs Lab 08/24/16 0634 08/25/16 0647 08/26/16 0652 08/28/16 0820  NA 142 142 143 144  K 3.5 3.4* 3.3* 3.7  CL 93* 92* 91* 93*  CO2 36* 38* 43* 39*  GLUCOSE 196* 138* 152* 108*  BUN 69* 72* 71* 60*  CREATININE 2.25* 2.41* 2.22* 2.14*  CALCIUM 8.8* 8.8* 8.8* 8.8*  MG  --   --  1.7  --   PHOS 2.5  --   --   --     Liver Function Tests:  Recent Labs Lab 08/22/16 1138 08/24/16 0634  AST 25  --   ALT 35  --   ALKPHOS 65  --   BILITOT 0.6  --   PROT 4.9*  --   ALBUMIN 2.2* 2.2*   No results for input(s): LIPASE, AMYLASE in the last 168 hours.  Recent Labs Lab 08/26/16 1644  AMMONIA 46*    CBC:  Recent Labs Lab 08/26/16 0652 08/28/16 0820  WBC 5.5 7.2  HGB 7.6* 8.1*  HCT 25.3* 26.7*  MCV 93.7 93.7  PLT 102* 144*    Cardiac Enzymes: No results for input(s): CKTOTAL, CKMB, CKMBINDEX, TROPONINI in the last 168 hours.  BNP: Invalid input(s): POCBNP  CBG: No results for input(s): GLUCAP in the last 168 hours.  Microbiology: Results for orders placed or performed during the hospital encounter of 08/05/16  Culture, Urine     Status: Abnormal   Collection Time: 08/26/16  2:50 PM  Result Value Ref Range Status   Specimen Description URINE, RANDOM  Final   Special Requests NONE  Final    Culture >=100,000 COLONIES/mL KLEBSIELLA PNEUMONIAE (A)  Final   Report Status 08/28/2016 FINAL  Final   Organism ID, Bacteria KLEBSIELLA PNEUMONIAE (A)  Final      Susceptibility   Klebsiella pneumoniae - MIC*    AMPICILLIN >=32 RESISTANT Resistant     CEFAZOLIN <=4 SENSITIVE Sensitive     CEFTRIAXONE <=1 SENSITIVE Sensitive     CIPROFLOXACIN <=0.25 SENSITIVE Sensitive     GENTAMICIN <=1 SENSITIVE Sensitive     IMIPENEM <=0.25 SENSITIVE Sensitive     NITROFURANTOIN 64 INTERMEDIATE Intermediate     TRIMETH/SULFA <=20 SENSITIVE Sensitive     AMPICILLIN/SULBACTAM 8 SENSITIVE Sensitive     PIP/TAZO <=4 SENSITIVE Sensitive     Extended ESBL NEGATIVE Sensitive     * >=100,000 COLONIES/mL KLEBSIELLA PNEUMONIAE    Coagulation Studies: No results for input(s): LABPROT, INR in the last 72 hours.  Urinalysis:  Recent Labs  08/26/16 1450  COLORURINE YELLOW  LABSPEC 1.011  PHURINE 5.0  GLUCOSEU NEGATIVE  HGBUR SMALL*  BILIRUBINUR NEGATIVE  KETONESUR NEGATIVE  PROTEINUR 30*  NITRITE NEGATIVE  LEUKOCYTESUR LARGE*      Imaging: No results found.   Medications:       Assessment/ Plan:  70 y.o. 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  1. Acute renal failure/proteinuria. Baseline creatinine 1.0. Acute renal failure secondary to sepsis most likely.  Renal function continues to improve. BUN down to 60 with a creatinine of 2.14. Continue to monitor renal function closely. No acute indication for dialysis. We will go ahead and discontinue temporary dialysis catheter.  2.  Anemia not otherwise specified. Hemoglobin currently 8.1. Continue to monitor CBC periodically.   3.  We will continue to follow.    LOS:  0 Derenda Giddings 12/11/20173:54 PM

## 2016-08-29 LAB — BLOOD GAS, ARTERIAL
Acid-Base Excess: 11.4 mmol/L — ABNORMAL HIGH (ref 0.0–2.0)
Acid-Base Excess: 11.9 mmol/L — ABNORMAL HIGH (ref 0.0–2.0)
BICARBONATE: 39.2 mmol/L — AB (ref 20.0–28.0)
BICARBONATE: 38.4 mmol/L — AB (ref 20.0–28.0)
Delivery systems: POSITIVE
Delivery systems: POSITIVE
EXPIRATORY PAP: 8
Expiratory PAP: 8
FIO2: 70
FIO2: 30
Inspiratory PAP: 16
Inspiratory PAP: 18
O2 SAT: 95.7 %
O2 Saturation: 99 %
PATIENT TEMPERATURE: 98.6
PATIENT TEMPERATURE: 98.6
PH ART: 7.304 — AB (ref 7.350–7.450)
PO2 ART: 159 mmHg — AB (ref 83.0–108.0)
PO2 ART: 75.7 mmHg — AB (ref 83.0–108.0)
RATE: 10 resp/min
pCO2 arterial: 99.6 mmHg (ref 32.0–48.0)
pCO2 arterial: 79.9 mmHg (ref 32.0–48.0)
pH, Arterial: 7.219 — ABNORMAL LOW (ref 7.350–7.450)

## 2016-08-30 NOTE — Progress Notes (Signed)
[facility]  ROUNDING NOTE   Subjective:  No new renal function testing today. Last creatinine was 2.14. Patient is still periodically confused.   Objective:  Vital signs in last 24 hours:  Pulse 60 respirations 20 blood pressure 119/59 pulse is 92%  Physical Exam: General: No acute distress  Head: normocephalic  Eyes/ENT: Anicteric, Moist oral mucosal membranes  Neck: Supple  Lungs:  Scattered rhonchi, basilar crackles  Heart: S1S2 no rubs  Abdomen:  Soft, nontender  Extremities: trace peripheral edema.  Neurologic: Alert, follows simple commands, oriented to self and partially to place.  Skin: No lesions  Access: L IJ dialysis catheter    Basic Metabolic Panel:  Recent Labs Lab 08/24/16 0634 08/25/16 0647 08/26/16 0652 08/28/16 0820  NA 142 142 143 144  K 3.5 3.4* 3.3* 3.7  CL 93* 92* 91* 93*  CO2 36* 38* 43* 39*  GLUCOSE 196* 138* 152* 108*  BUN 69* 72* 71* 60*  CREATININE 2.25* 2.41* 2.22* 2.14*  CALCIUM 8.8* 8.8* 8.8* 8.8*  MG  --   --  1.7  --   PHOS 2.5  --   --   --     Liver Function Tests:  Recent Labs Lab 08/24/16 0634  ALBUMIN 2.2*   No results for input(s): LIPASE, AMYLASE in the last 168 hours.  Recent Labs Lab 08/26/16 1644  AMMONIA 46*    CBC:  Recent Labs Lab 08/26/16 0652 08/28/16 0820  WBC 5.5 7.2  HGB 7.6* 8.1*  HCT 25.3* 26.7*  MCV 93.7 93.7  PLT 102* 144*    Cardiac Enzymes: No results for input(s): CKTOTAL, CKMB, CKMBINDEX, TROPONINI in the last 168 hours.  BNP: Invalid input(s): POCBNP  CBG: No results for input(s): GLUCAP in the last 168 hours.  Microbiology: Results for orders placed or performed during the hospital encounter of 08/05/16  Culture, Urine     Status: Abnormal   Collection Time: 08/26/16  2:50 PM  Result Value Ref Range Status   Specimen Description URINE, RANDOM  Final   Special Requests NONE  Final   Culture >=100,000 COLONIES/mL KLEBSIELLA PNEUMONIAE (A)  Final   Report  Status 08/28/2016 FINAL  Final   Organism ID, Bacteria KLEBSIELLA PNEUMONIAE (A)  Final      Susceptibility   Klebsiella pneumoniae - MIC*    AMPICILLIN >=32 RESISTANT Resistant     CEFAZOLIN <=4 SENSITIVE Sensitive     CEFTRIAXONE <=1 SENSITIVE Sensitive     CIPROFLOXACIN <=0.25 SENSITIVE Sensitive     GENTAMICIN <=1 SENSITIVE Sensitive     IMIPENEM <=0.25 SENSITIVE Sensitive     NITROFURANTOIN 64 INTERMEDIATE Intermediate     TRIMETH/SULFA <=20 SENSITIVE Sensitive     AMPICILLIN/SULBACTAM 8 SENSITIVE Sensitive     PIP/TAZO <=4 SENSITIVE Sensitive     Extended ESBL NEGATIVE Sensitive     * >=100,000 COLONIES/mL KLEBSIELLA PNEUMONIAE    Coagulation Studies: No results for input(s): LABPROT, INR in the last 72 hours.  Urinalysis: No results for input(s): COLORURINE, LABSPEC, PHURINE, GLUCOSEU, HGBUR, BILIRUBINUR, KETONESUR, PROTEINUR, UROBILINOGEN, NITRITE, LEUKOCYTESUR in the last 72 hours.  Invalid input(s): APPERANCEUR    Imaging: No results found.   Medications:       Assessment/ Plan:  70 y.o. 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  1. Acute  renal failure/proteinuria. Baseline creatinine 1.0. Acute renal failure secondary to sepsis most likely.  No new renal function testing today. We will reorder renal function testing for tomorrow. We ordered removal of temporary dialysis catheter however patient has no other IV access and need for antibiotic administration.  We are currently searching for newly S. Access.  2.  Anemia not otherwise specified. followup CBC tomorrow.   3.  Acute respiratory failure. Periodically PCO2 has been elevated. Hospitalist following respiratory status.    LOS: 0 Nardos Putnam,  Mikhail Hallenbeck 12/13/20173:41 PM

## 2016-08-31 LAB — CBC
HCT: 24.1 % — ABNORMAL LOW (ref 39.0–52.0)
Hemoglobin: 7.1 g/dL — ABNORMAL LOW (ref 13.0–17.0)
MCH: 28 pg (ref 26.0–34.0)
MCHC: 29.5 g/dL — ABNORMAL LOW (ref 30.0–36.0)
MCV: 94.9 fL (ref 78.0–100.0)
PLATELETS: 187 10*3/uL (ref 150–400)
RBC: 2.54 MIL/uL — AB (ref 4.22–5.81)
RDW: 15.7 % — ABNORMAL HIGH (ref 11.5–15.5)
WBC: 7.6 10*3/uL (ref 4.0–10.5)

## 2016-08-31 LAB — RENAL FUNCTION PANEL
Albumin: 2.1 g/dL — ABNORMAL LOW (ref 3.5–5.0)
Anion gap: 9 (ref 5–15)
BUN: 41 mg/dL — ABNORMAL HIGH (ref 6–20)
CALCIUM: 8.5 mg/dL — AB (ref 8.9–10.3)
CHLORIDE: 94 mmol/L — AB (ref 101–111)
CO2: 39 mmol/L — ABNORMAL HIGH (ref 22–32)
CREATININE: 2.19 mg/dL — AB (ref 0.61–1.24)
GFR, EST AFRICAN AMERICAN: 34 mL/min — AB (ref >60)
GFR, EST NON AFRICAN AMERICAN: 29 mL/min — AB (ref >60)
Glucose, Bld: 115 mg/dL — ABNORMAL HIGH (ref 65–99)
Phosphorus: 3.4 mg/dL (ref 2.5–4.6)
Potassium: 3.8 mmol/L (ref 3.5–5.1)
SODIUM: 142 mmol/L (ref 135–145)

## 2016-08-31 LAB — BLOOD GAS, ARTERIAL
ACID-BASE EXCESS: 12.7 mmol/L — AB (ref 0.0–2.0)
Bicarbonate: 39.1 mmol/L — ABNORMAL HIGH (ref 20.0–28.0)
FIO2: 40
O2 SAT: 97.4 %
Patient temperature: 98.6
pCO2 arterial: 78.1 mmHg (ref 32.0–48.0)
pH, Arterial: 7.321 — ABNORMAL LOW (ref 7.350–7.450)
pO2, Arterial: 94.2 mmHg (ref 83.0–108.0)

## 2016-09-01 NOTE — Progress Notes (Signed)
[facility]  ROUNDING NOTE   Subjective:  BUN down to 41, creatinine 2.1. Nursing has needed the temporary dialysis catheter for the third port for IV antibiotic administration.  Objective:  Vital signs in last 24 hours:  Pulse 60 respirations 33 blood pressure 117/64 pulse ox 91%  Physical Exam: General: No acute distress  Head: normocephalic  Eyes/ENT: Anicteric, Moist oral mucosal membranes  Neck: Supple  Lungs:  Scattered rhonchi, basilar crackles  Heart: S1S2 no rubs  Abdomen:  Soft, nontender  Extremities: trace peripheral edema.  Neurologic: Confused, awake, will follow simple commands  Skin: No lesions  Access: L IJ dialysis catheter    Basic Metabolic Panel:  Recent Labs Lab 08/26/16 0652 08/28/16 0820 08/31/16 0427  NA 143 144 142  K 3.3* 3.7 3.8  CL 91* 93* 94*  CO2 43* 39* 39*  GLUCOSE 152* 108* 115*  BUN 71* 60* 41*  CREATININE 2.22* 2.14* 2.19*  CALCIUM 8.8* 8.8* 8.5*  MG 1.7  --   --   PHOS  --   --  3.4    Liver Function Tests:  Recent Labs Lab 08/31/16 0427  ALBUMIN 2.1*   No results for input(s): LIPASE, AMYLASE in the last 168 hours.  Recent Labs Lab 08/26/16 1644  AMMONIA 46*    CBC:  Recent Labs Lab 08/26/16 0652 08/28/16 0820 08/31/16 0427  WBC 5.5 7.2 7.6  HGB 7.6* 8.1* 7.1*  HCT 25.3* 26.7* 24.1*  MCV 93.7 93.7 94.9  PLT 102* 144* 187    Cardiac Enzymes: No results for input(s): CKTOTAL, CKMB, CKMBINDEX, TROPONINI in the last 168 hours.  BNP: Invalid input(s): POCBNP  CBG: No results for input(s): GLUCAP in the last 168 hours.  Microbiology: Results for orders placed or performed during the hospital encounter of 08/05/16  Culture, Urine     Status: Abnormal   Collection Time: 08/26/16  2:50 PM  Result Value Ref Range Status   Specimen Description URINE, RANDOM  Final   Special Requests NONE  Final   Culture >=100,000 COLONIES/mL KLEBSIELLA PNEUMONIAE (A)  Final   Report Status 08/28/2016  FINAL  Final   Organism ID, Bacteria KLEBSIELLA PNEUMONIAE (A)  Final      Susceptibility   Klebsiella pneumoniae - MIC*    AMPICILLIN >=32 RESISTANT Resistant     CEFAZOLIN <=4 SENSITIVE Sensitive     CEFTRIAXONE <=1 SENSITIVE Sensitive     CIPROFLOXACIN <=0.25 SENSITIVE Sensitive     GENTAMICIN <=1 SENSITIVE Sensitive     IMIPENEM <=0.25 SENSITIVE Sensitive     NITROFURANTOIN 64 INTERMEDIATE Intermediate     TRIMETH/SULFA <=20 SENSITIVE Sensitive     AMPICILLIN/SULBACTAM 8 SENSITIVE Sensitive     PIP/TAZO <=4 SENSITIVE Sensitive     Extended ESBL NEGATIVE Sensitive     * >=100,000 COLONIES/mL KLEBSIELLA PNEUMONIAE    Coagulation Studies: No results for input(s): LABPROT, INR in the last 72 hours.  Urinalysis: No results for input(s): COLORURINE, LABSPEC, PHURINE, GLUCOSEU, HGBUR, BILIRUBINUR, KETONESUR, PROTEINUR, UROBILINOGEN, NITRITE, LEUKOCYTESUR in the last 72 hours.  Invalid input(s): APPERANCEUR    Imaging: No results found.   Medications:       Assessment/ Plan:  70 y.o. 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  1. Acute renal failure/proteinuria. Baseline creatinine 1.0. Acute renal failure secondary  to sepsis most likely.  Creatinine has been stable over the past week. Currently 2.1. BUN however has come down to 41. Continue to monitor her renal function periodically. The patient still has a left internal jugular dialysis catheter in place. Nursing has requested to leave this in place in order for the patient to continue to receive IV antibiotics.  2.  Anemia not otherwise specified. Hemoglobin has dropped to 7.1. Consider blood transfusion for hemoglobin of 7 or less. Continue to periodically  monitor CBC.   3.  Acute respiratory failure. Patient does have intermittent tachypnea. Continue to monitor his respiratory status closely as he has history of recent respiratory failure.   LOS: 0 Khristina Janota 12/15/20178:53 AM

## 2016-09-02 ENCOUNTER — Other Ambulatory Visit (HOSPITAL_COMMUNITY): Payer: Medicare Other

## 2016-09-02 LAB — BLOOD GAS, ARTERIAL
Acid-Base Excess: 12.9 mmol/L — ABNORMAL HIGH (ref 0.0–2.0)
Bicarbonate: 39.2 mmol/L — ABNORMAL HIGH (ref 20.0–28.0)
DELIVERY SYSTEMS: POSITIVE
Expiratory PAP: 8
FIO2: 40
INSPIRATORY PAP: 18
Mode: POSITIVE
O2 Saturation: 88.5 %
PATIENT TEMPERATURE: 98.6
PO2 ART: 55.8 mmHg — AB (ref 83.0–108.0)
pCO2 arterial: 76.7 mmHg (ref 32.0–48.0)
pH, Arterial: 7.329 — ABNORMAL LOW (ref 7.350–7.450)

## 2016-09-03 LAB — BASIC METABOLIC PANEL
Anion gap: 15 (ref 5–15)
BUN: 37 mg/dL — ABNORMAL HIGH (ref 6–20)
CALCIUM: 9 mg/dL (ref 8.9–10.3)
CHLORIDE: 97 mmol/L — AB (ref 101–111)
CO2: 33 mmol/L — AB (ref 22–32)
CREATININE: 2.12 mg/dL — AB (ref 0.61–1.24)
GFR calc non Af Amer: 30 mL/min — ABNORMAL LOW (ref >60)
GFR, EST AFRICAN AMERICAN: 35 mL/min — AB (ref >60)
GLUCOSE: 74 mg/dL (ref 65–99)
Potassium: 3.4 mmol/L — ABNORMAL LOW (ref 3.5–5.1)
Sodium: 145 mmol/L (ref 135–145)

## 2016-09-04 LAB — DIGOXIN LEVEL: DIGOXIN LVL: 1.2 ng/mL (ref 0.8–2.0)

## 2016-09-04 LAB — POTASSIUM: Potassium: 3.2 mmol/L — ABNORMAL LOW (ref 3.5–5.1)

## 2016-11-16 DEATH — deceased

## 2018-03-14 IMAGING — CR DG CHEST 1V PORT
2 series · 2 of 2 positions shown · non-contrast
Comparison: None

CLINICAL DATA: Respiratory for

EXAM:
PORTABLE CHEST 1 VIEW

[AP (1 of 2)]
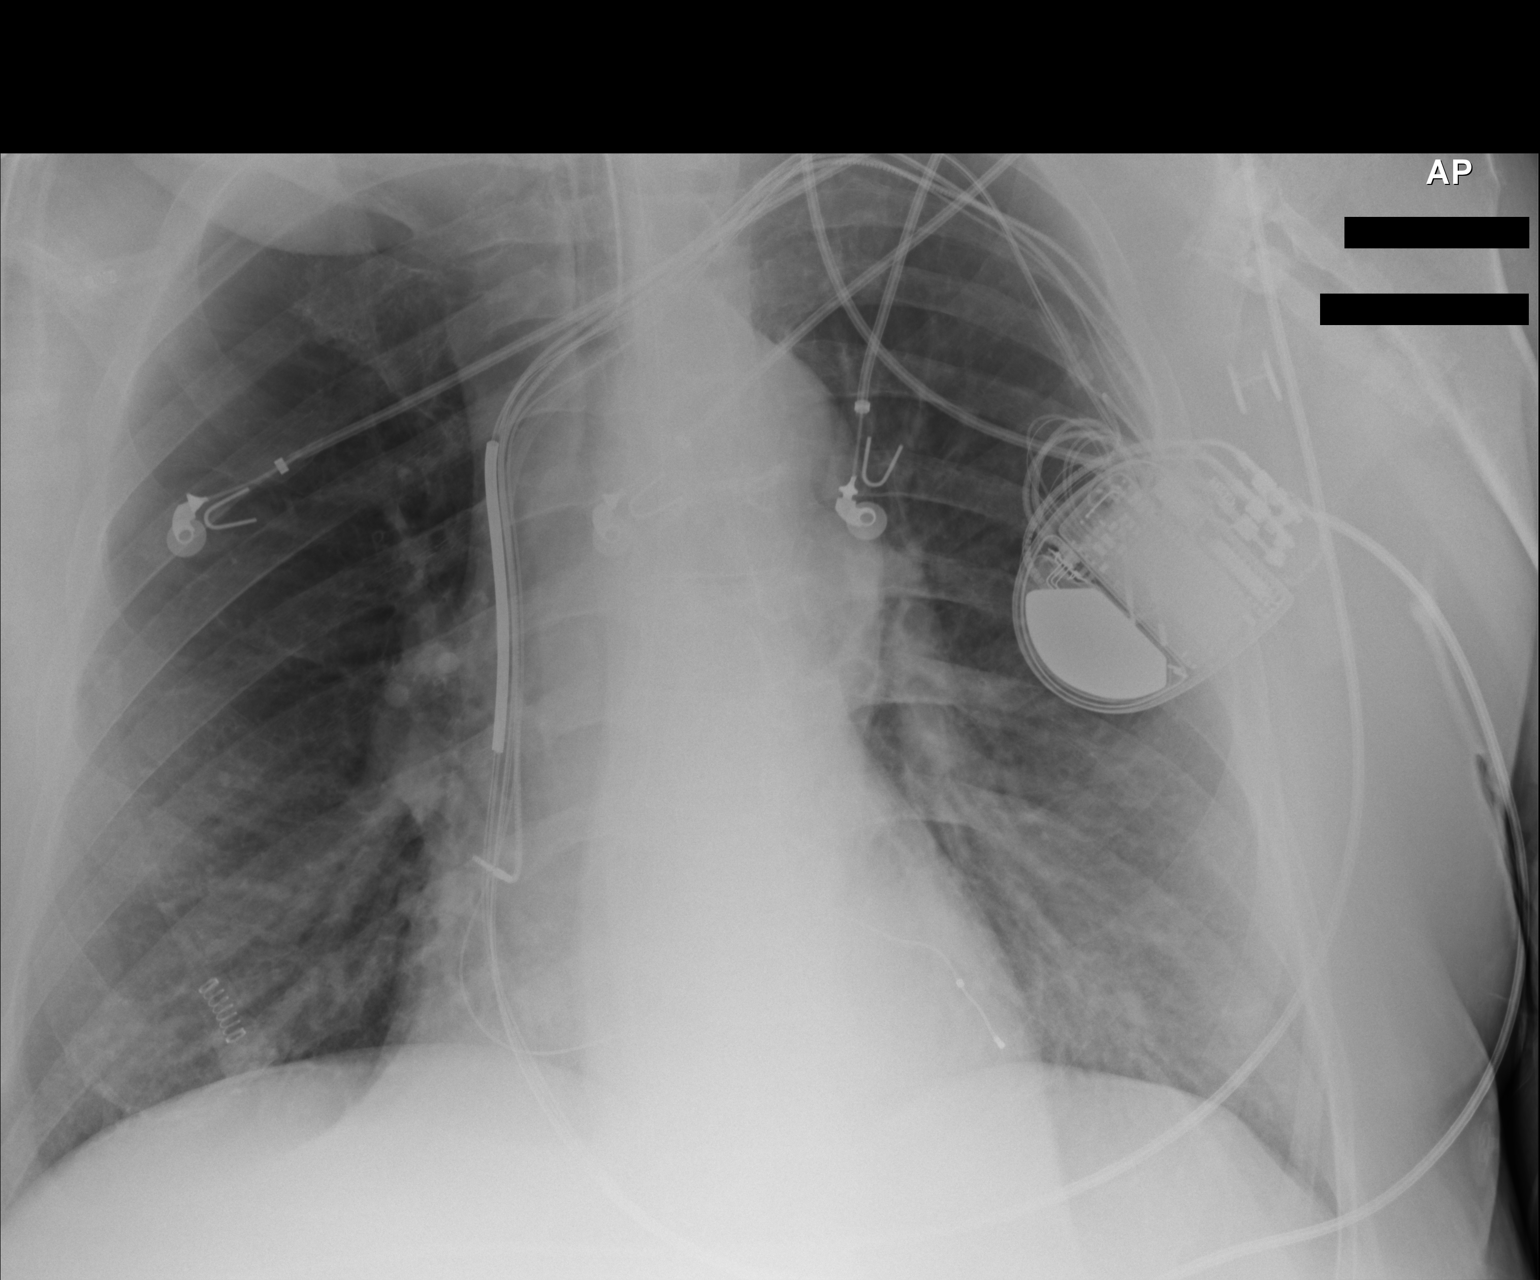

[AP (2 of 2)]
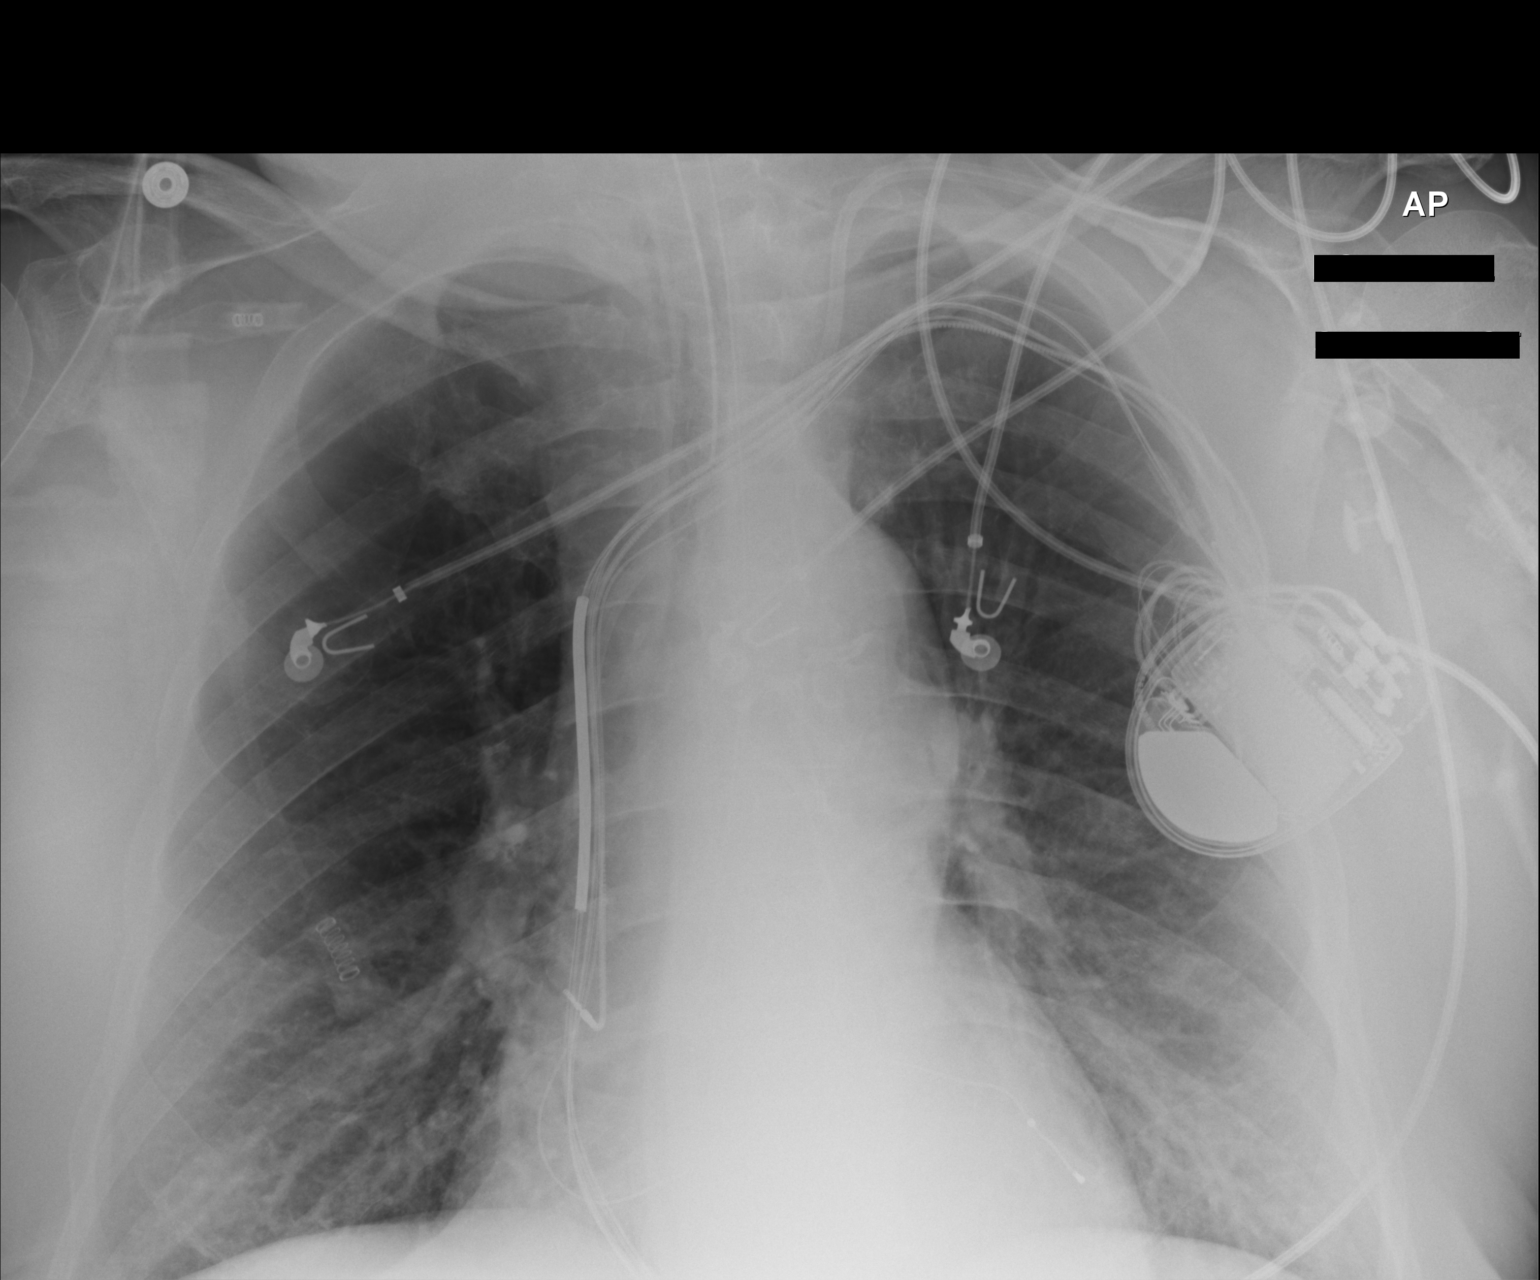

[2 of 2 positions shown; findings below may reference images not displayed]

FINDINGS: Endotracheal tube a is 6 cm above the carina. Nd left AICD remains
in place, unchanged. Heart is normal size. No confluent airspace
opacities or effusions. Mild hyperinflation.
IMPRESSION: Mild hyperinflation.  No acute findings.

Endotracheal tube 6 cm above the carina.

## 2018-03-16 IMAGING — CR DG CHEST 1V PORT
2 series · 2 of 2 positions shown · non-contrast
Comparison: 08/06/2016.

CLINICAL DATA: Ventilator dependent respiratory failure.

EXAM:
PORTABLE CHEST 1 VIEW

[portable (1 of 2)]
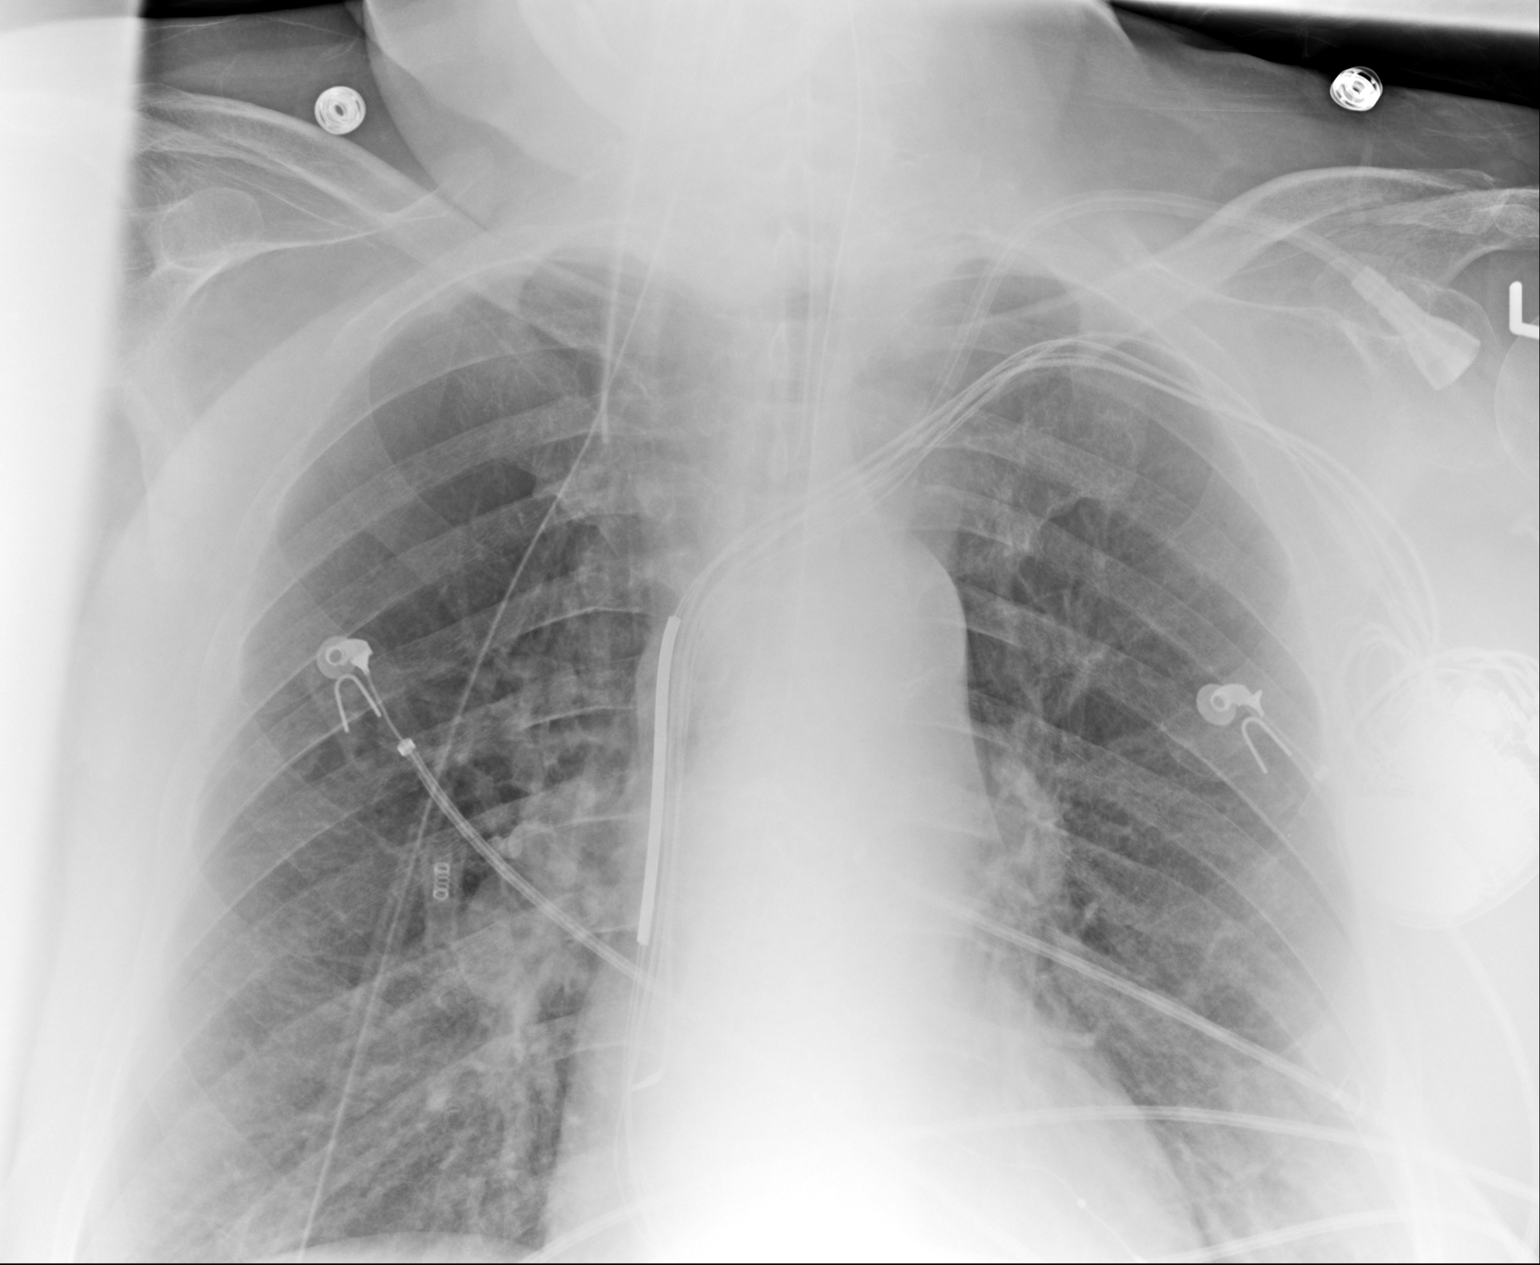

[portable (2 of 2)]
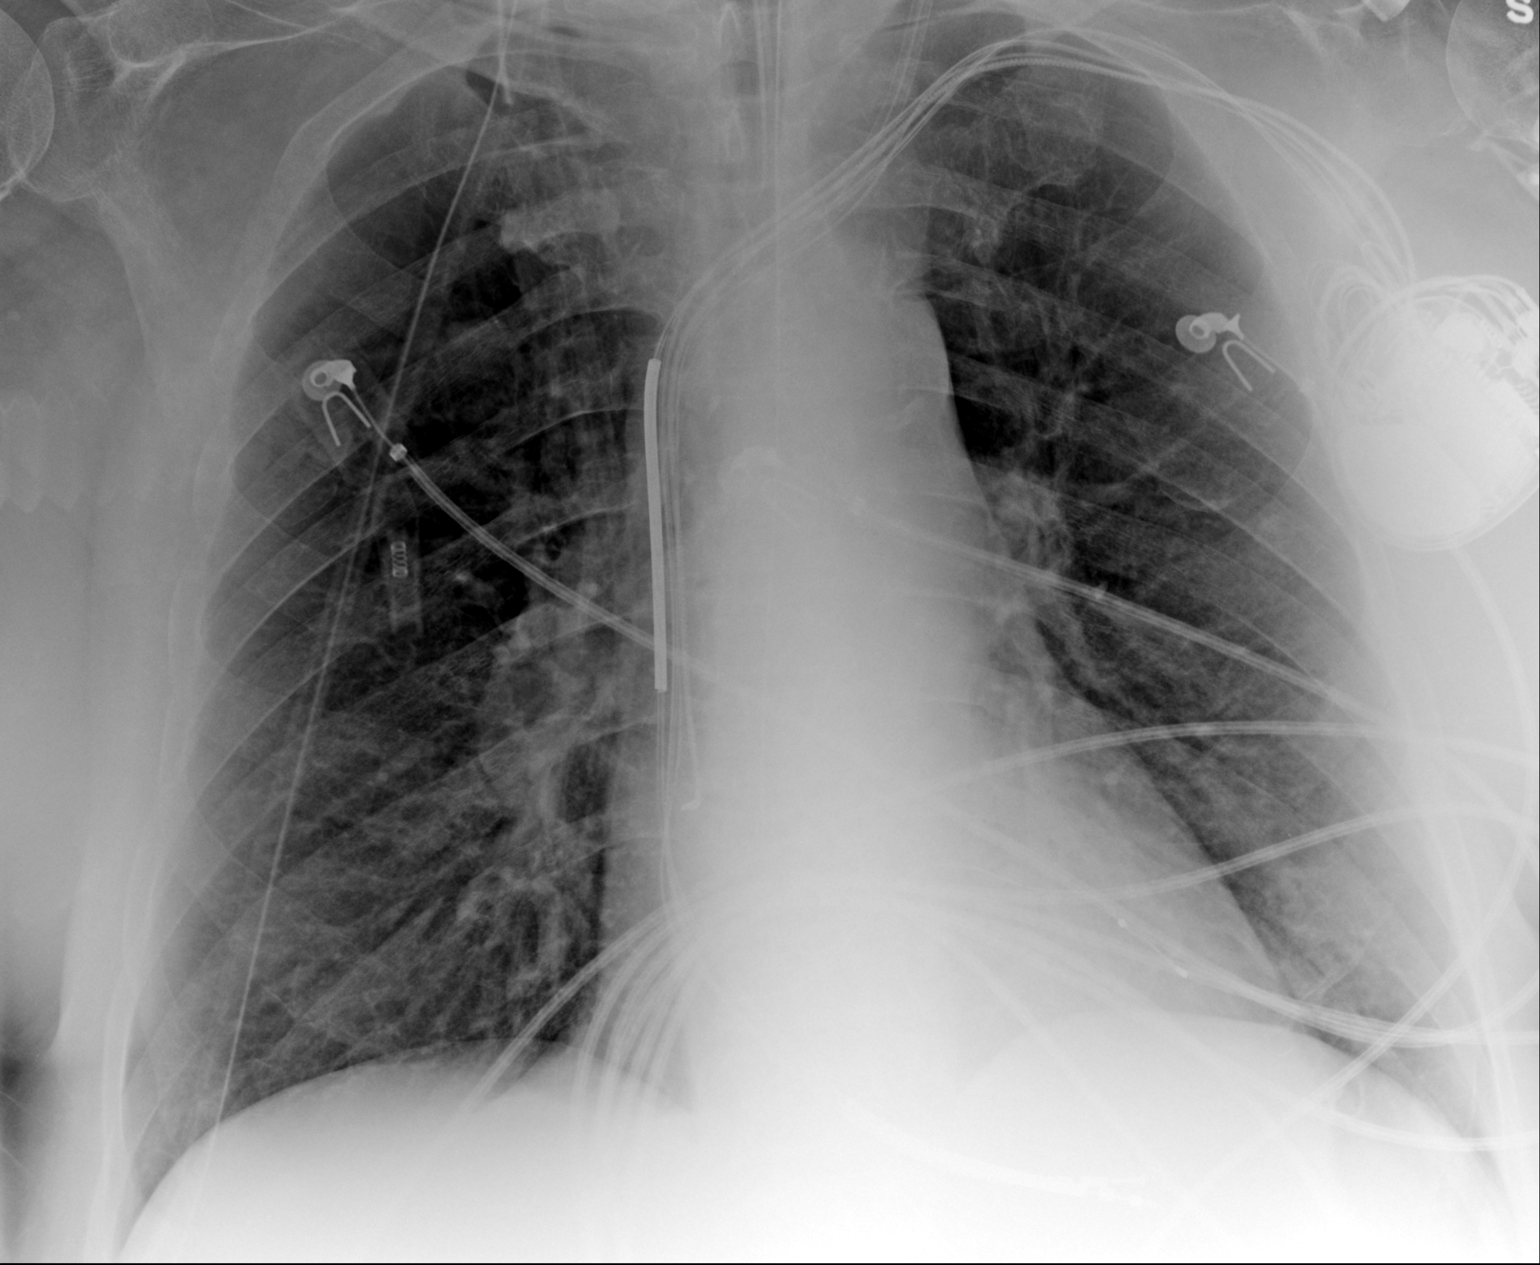

[2 of 2 positions shown; findings below may reference images not displayed]

FINDINGS: Endotracheal tube tip in satisfactory position projecting
approximately 6 cm above the carina. Left subclavian biventricular
pacing defibrillator unchanged.

Cardiac silhouette upper normal in size, unchanged. Thoracic aorta
mildly atherosclerotic, unchanged. Hilar and mediastinal contours
otherwise unremarkable. Lungs clear. Bronchovascular markings
normal. Pulmonary vascularity normal. No visible pleural effusions.
No pneumothorax.
IMPRESSION: 1.  Support apparatus satisfactory.
2.  No acute cardiopulmonary disease.
3. Mild thoracic aortic atherosclerosis.

## 2018-03-16 IMAGING — CR DG ABD PORTABLE 1V
2 series · 2 of 2 positions shown · non-contrast
Comparison: 08/05/2016

CLINICAL DATA: Ileus

EXAM:
PORTABLE ABDOMEN - 1 VIEW

[supine ap (1 of 2)]
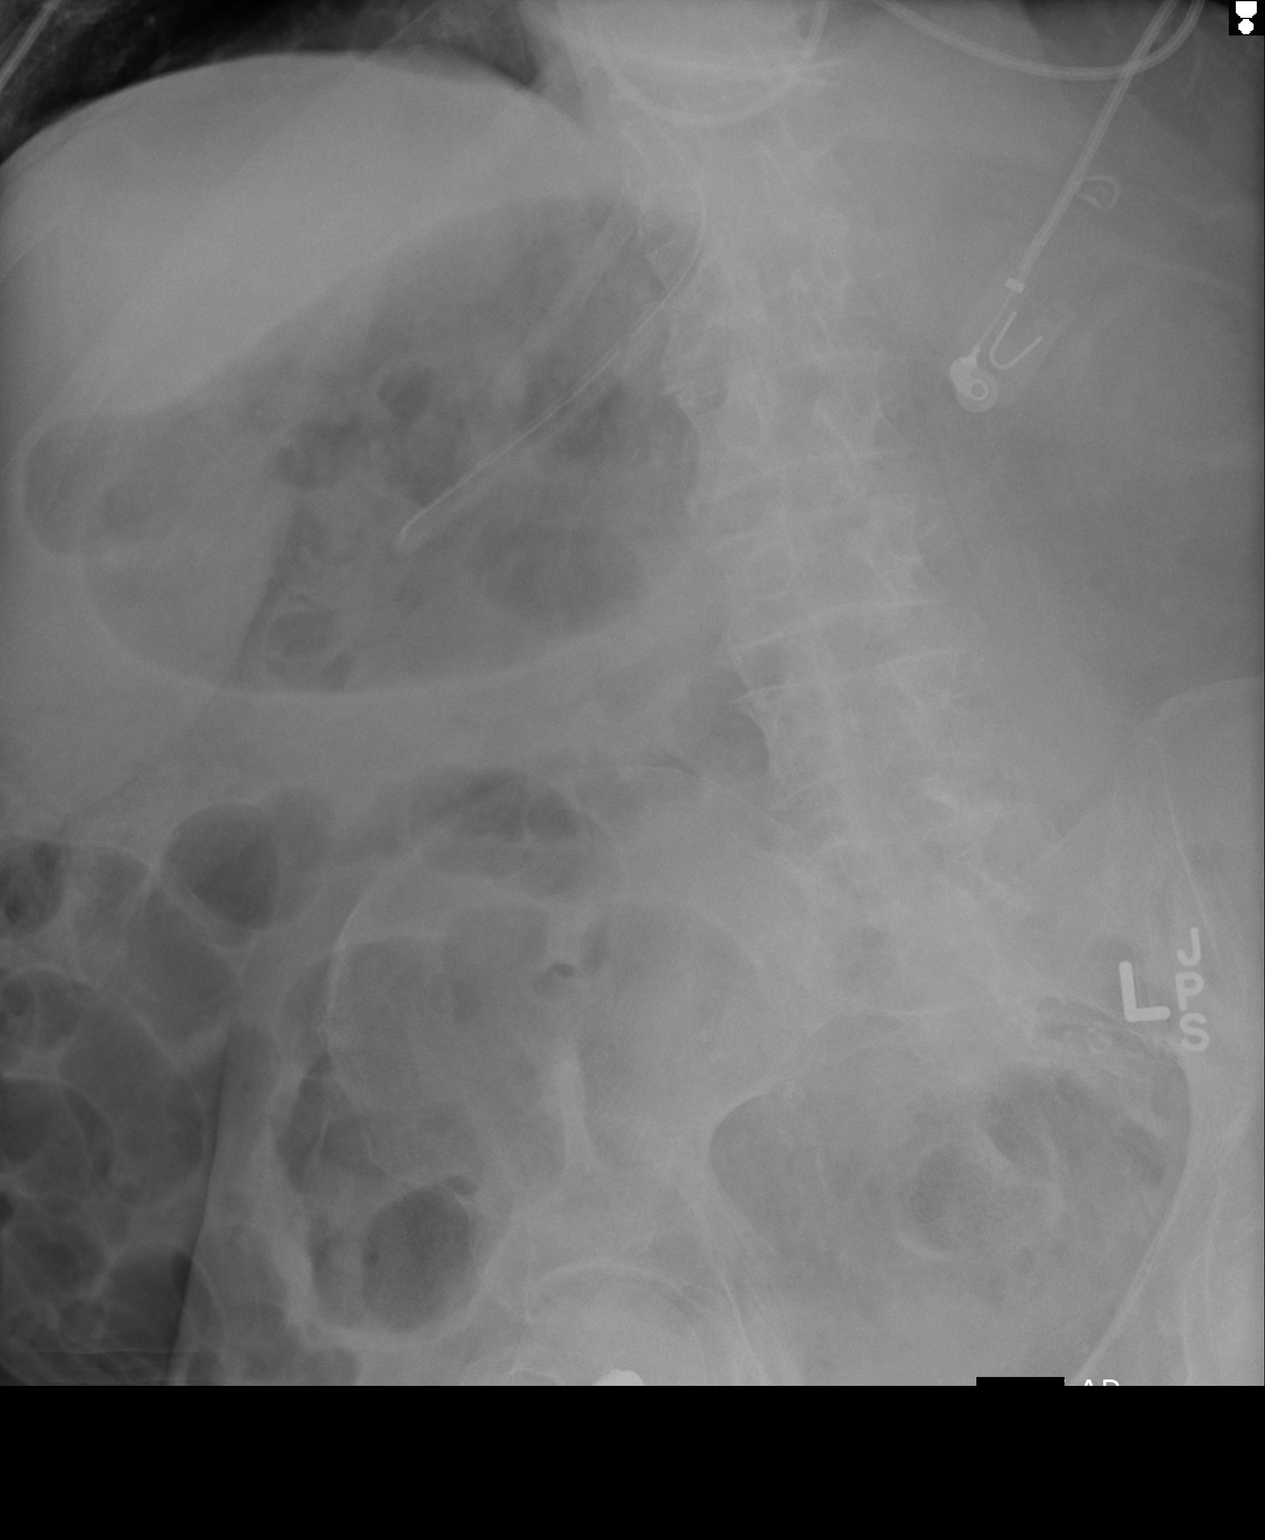

[supine ap (2 of 2)]
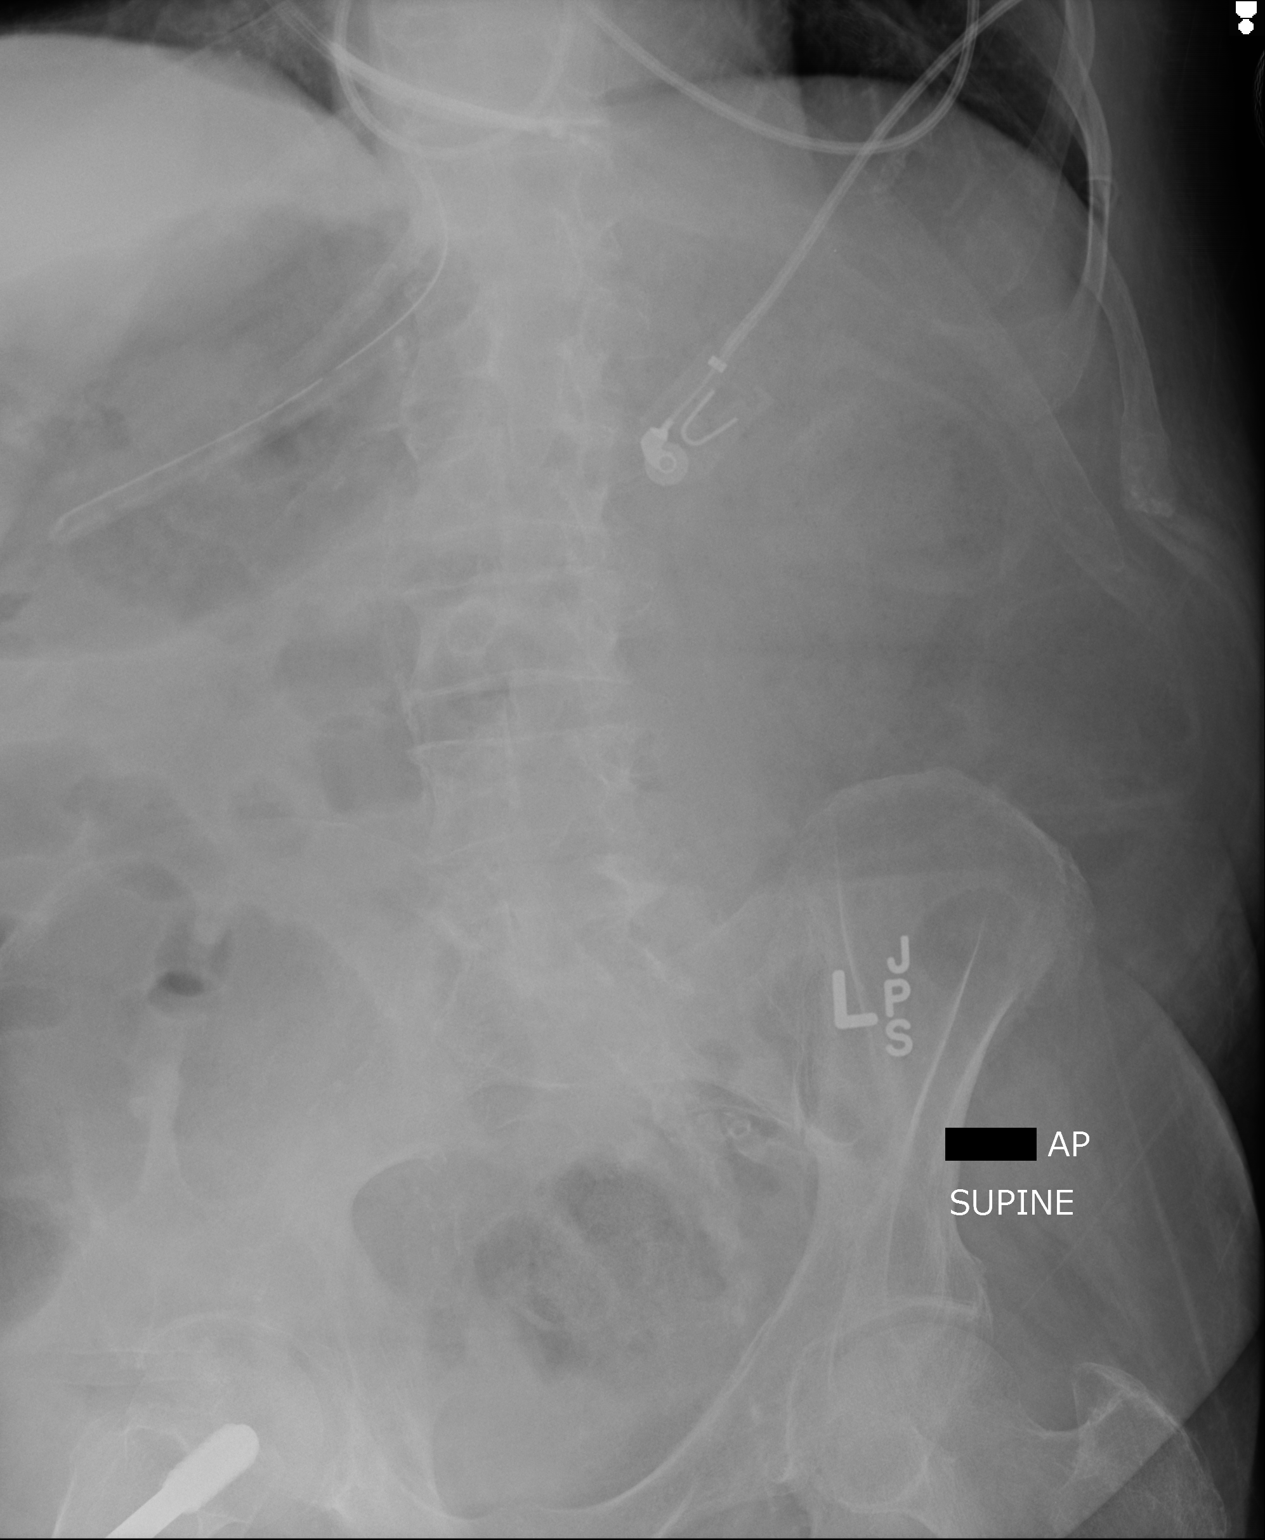

[2 of 2 positions shown; findings below may reference images not displayed]

FINDINGS: The descending and sigmoid colon is distended and contains stool,
similar to prior. Nasogastric or orogastric tube within the
moderately gas distended stomach. No evidence of small bowel
obstruction.

Limited study due to patient size and pannus displacement to the
right.
IMPRESSION: 1. Continued colonic ileus, with greater stool volume.
2. Moderate gaseous distention of the stomach. Orogastric tube in
good position.
